# Patient Record
Sex: Female | Born: 1940 | Race: White | Hispanic: No | Smoking: Never smoker
Health system: Southern US, Community
[De-identification: ages and names within clinical notes are randomized; demographics above are authoritative.]

## PROBLEM LIST (undated history)

## (undated) DIAGNOSIS — E039 Hypothyroidism, unspecified: Secondary | ICD-10-CM

## (undated) DIAGNOSIS — N952 Postmenopausal atrophic vaginitis: Secondary | ICD-10-CM

## (undated) DIAGNOSIS — Z972 Presence of dental prosthetic device (complete) (partial): Secondary | ICD-10-CM

## (undated) DIAGNOSIS — R339 Retention of urine, unspecified: Secondary | ICD-10-CM

## (undated) DIAGNOSIS — M1990 Unspecified osteoarthritis, unspecified site: Secondary | ICD-10-CM

## (undated) DIAGNOSIS — I10 Essential (primary) hypertension: Secondary | ICD-10-CM

## (undated) DIAGNOSIS — N8110 Cystocele, unspecified: Secondary | ICD-10-CM

## (undated) DIAGNOSIS — M3320 Polymyositis, organ involvement unspecified: Secondary | ICD-10-CM

## (undated) DIAGNOSIS — G709 Myoneural disorder, unspecified: Secondary | ICD-10-CM

## (undated) HISTORY — DX: Essential (primary) hypertension: I10

## (undated) HISTORY — DX: Postmenopausal atrophic vaginitis: N95.2

## (undated) HISTORY — PX: ABDOMINAL HYSTERECTOMY: SHX81

## (undated) HISTORY — DX: Unspecified osteoarthritis, unspecified site: M19.90

## (undated) HISTORY — PX: CARPAL TUNNEL RELEASE: SHX101

## (undated) HISTORY — PX: COLONOSCOPY: SHX174

## (undated) HISTORY — DX: Cystocele, unspecified: N81.10

## (undated) HISTORY — DX: Retention of urine, unspecified: R33.9

## (undated) HISTORY — DX: Myoneural disorder, unspecified: G70.9

## (undated) HISTORY — PX: MUSCLE BIOPSY: SHX716

---

## 1991-07-26 HISTORY — PX: OTHER SURGICAL HISTORY: SHX169

## 1994-07-25 HISTORY — PX: REPLACEMENT TOTAL HIP W/  RESURFACING IMPLANTS: SUR1222

## 2001-05-09 ENCOUNTER — Encounter (INDEPENDENT_AMBULATORY_CARE_PROVIDER_SITE_OTHER): Payer: Self-pay | Admitting: *Deleted

## 2001-05-10 ENCOUNTER — Inpatient Hospital Stay (HOSPITAL_COMMUNITY): Admission: EM | Admit: 2001-05-10 | Discharge: 2001-05-12 | Payer: Self-pay | Admitting: Emergency Medicine

## 2001-06-04 ENCOUNTER — Other Ambulatory Visit: Admission: RE | Admit: 2001-06-04 | Discharge: 2001-06-04 | Payer: Self-pay | Admitting: Obstetrics and Gynecology

## 2004-10-28 ENCOUNTER — Ambulatory Visit: Payer: Self-pay

## 2005-10-31 ENCOUNTER — Ambulatory Visit: Payer: Self-pay

## 2006-11-06 ENCOUNTER — Ambulatory Visit: Payer: Self-pay | Admitting: Internal Medicine

## 2007-01-22 ENCOUNTER — Ambulatory Visit (HOSPITAL_COMMUNITY): Admission: RE | Admit: 2007-01-22 | Discharge: 2007-01-22 | Payer: Self-pay | Admitting: *Deleted

## 2007-11-21 ENCOUNTER — Ambulatory Visit: Payer: Self-pay | Admitting: Internal Medicine

## 2007-12-24 DIAGNOSIS — D239 Other benign neoplasm of skin, unspecified: Secondary | ICD-10-CM

## 2007-12-24 HISTORY — DX: Other benign neoplasm of skin, unspecified: D23.9

## 2008-11-24 ENCOUNTER — Ambulatory Visit: Payer: Self-pay | Admitting: Internal Medicine

## 2009-11-25 ENCOUNTER — Ambulatory Visit: Payer: Self-pay | Admitting: Internal Medicine

## 2010-11-30 ENCOUNTER — Ambulatory Visit: Payer: Self-pay | Admitting: Internal Medicine

## 2010-12-07 NOTE — Op Note (Signed)
Cassidy Bell, Cassidy Bell NO.:  [id number]   MEDICAL RECORD NO.:  [id number]          PATIENT TYPE:  AMB   LOCATION:  ENDO                         FACILITY:  [facility]   PHYSICIAN:  Georgiana Spinner, M.D.    DATE OF BIRTH:  08-20-1940   DATE OF PROCEDURE:  DATE OF DISCHARGE:                               OPERATIVE REPORT   Audio too short to transcribe (less than 5 seconds)           ______________________________  Georgiana Spinner, M.D.     GMO/MEDQ  D:  01/22/2007  T:  01/22/2007  Job:  [id number]

## 2010-12-07 NOTE — Op Note (Signed)
NAMENAYANA, LENIG NO.:  [id number]   MEDICAL RECORD NO.:  [id number]          PATIENT TYPE:  AMB   LOCATION:  ENDO                         FACILITY:  [facility]   PHYSICIAN:  Georgiana Spinner, M.D.    DATE OF BIRTH:  August 16, 1940   DATE OF PROCEDURE:  DATE OF DISCHARGE:                               OPERATIVE REPORT   PROCEDURE:  Colonoscopy.   INDICATIONS:  Colon cancer screening.   ANESTHESIA:  Demerol 100 mg, Versed 10 mg.   PROCEDURE:  With the patient mildly sedated in the left lateral  decubitus position, the Pentax videoscopic colonoscope was inserted in  the rectum and passed under direct vision with pressure applied until we  reached the cecum, identified by the ileocecal valve and appendiceal  orifice, both of which were photographed.  From this point the  colonoscope was slowly withdrawn, taking circumferential views of the  colonic mucosa as best we could see.  There was quite a bit of  tortuosity to the colon, and some areas could not be optimally  visualized despite maneuvers to do so, but no gross lesions were seen as  we withdrew all the way to the rectum which appeared normal on direct  and showed hemorrhoids on retroflexed view.  The endoscope was  straightened and withdrawn.  The patient's vital signs and pulse  oximeter remained stable.  The patient tolerated the procedure well  without apparent complication.   FINDINGS:  Internal hemorrhoids; otherwise, an unremarkable examination.   PLAN:  Because of family history, will repeat examination in 5 years or  as needed.           ______________________________  Georgiana Spinner, M.D.     GMO/MEDQ  D:  01/22/2007  T:  01/22/2007  Job:  [id number]

## 2010-12-10 NOTE — Discharge Summary (Signed)
[name]  Patient:    Cassidy Bell, Cassidy Bell Visit Number: [id number] MRN: [id number]          Service Type: MED Location: [phone] Attending Physician:  Sabino Gasser Dictated by:   Sabino Gasser, M.D. Admit Date:  05/09/2001 Discharge Date: 05/12/2001                             Discharge Summary  HISTORY OF PRESENT ILLNESS:  Ms. Aguilera is a 70 year old female with history of hypertension who presented with acute onset of abdominal pain followed by diarrhea and hematochezia.  She previously had been feeling well with no history of previous GI problems and negative colonoscopy about four years prior to admission.  REVIEW OF SYSTEMS:  Otherwise negative and no history of recent antibiotic use.  MEDICATIONS:  Zyrtec, Lotensin, and Premarin.  SOCIAL HISTORY:  She lives at home.  PHYSICAL EXAMINATION:  She was alert and oriented female in no distress.  Exam was notable for soft and nontender abdomen with positive bowel sounds.  LABS:  Essentially normal.  CT scan showed thickening of transverse and descending colon and she was admitted for observation on the 17th.  On the 18th, she underwent colonoscopy which showed a mostly mild colitis with some areas of more severe colitis involving the transverse and descending colon as noted on the CT scan. Biopsies were taken.  On the following day, the patient was tolerating a low residue diet with some degree of nausea but no vomiting. Abdomen was soft and nontender and vital signs were stable so it was elected to discharge her home. She was to call us for the results of the biopsies and follow-up as an outpatient.  DISPOSITION:  The patient was discharged home on her home medications as previously.  LABS:  Urinalysis was unremarkable.  CBC showed hematocrit 37.6, white count of 10,800.  CMET entirely normal except for an albumin of 3.4Dictated by: Sabino Gasser, M.D. Attending Physician:  Sabino Gasser DD:  05/12/04 TD:  05/14/01 Job: [id number]

## 2010-12-10 NOTE — Procedures (Signed)
[place]. [facility]  Patient:    Cassidy Bell, Cassidy Bell Visit Number: [id number] MRN: [id number]          Service Type: MED Location: [phone] Attending Physician:  Sabino Gasser Dictated by:   Sabino Gasser, M.D. Admit Date:  05/09/2001 Discharge Date: 05/12/2001                             Procedure Report  PROCEDURE:  Colonoscopy.  PROCEDURE INDICATION:  Acute onset of abdominal pain, rectal bleeding, and abnormal CT scan showing thickening of the transverse and descending colon.  ANESTHESIA:  Demerol 100 mg, Versed 10 mg.  DESCRIPTION OF PROCEDURE:  With the patient mildly sedated in the left lateral decubitus position, the Olympus videoscopic variable-stiffness colonoscope was inserted in the rectum and passed under direct vision through the thickened area to the cecum, identified by the ileocecal valve and appendiceal orifice, both of which were photographed.  From this point the colonoscope was slowly withdrawn, taking circumferential views of the entire colonic mucosa, stopping in the mid- to distal transverse colon and subsequently in the descending colon, where biopsies and photographs were taken of the inflamed mucosa.  It ranged from mild to moderate to one area of moderately severe, but this was a very small area in the descending colon.  All of these areas were biopsied. The endoscope was then slowly further withdrawn, taking circumferential views of the remaining colonic mucosa, stopping in the rectum, which appeared normal on direct and retroflex view.  The endoscope was straightened and withdrawn. The patients vital signs and pulse oximetry remained stable.  The patient tolerated the procedure well without apparent complications.  FINDINGS:  Changes of localized colitis.  At this time patient has improved, has active bowel sounds, abdomen is nontender.  We will allow her to be discharged from the hospital today with outpatient follow-up.   She will call me for the results of her biopsies next week and follow up with me as an outpatient as well.  We will place her on a low-fiber diet, low-residue diet for approximately two weeks. Dictated by:   Sabino Gasser, M.D. Attending Physician:  Sabino Gasser DD:  05/11/01 TD:  05/14/01 Job: [id number]

## 2011-06-29 ENCOUNTER — Telehealth: Payer: Self-pay | Admitting: Internal Medicine

## 2011-06-29 NOTE — Telephone Encounter (Signed)
Error.Cassidy Bell ° °

## 2011-08-08 DIAGNOSIS — J0100 Acute maxillary sinusitis, unspecified: Secondary | ICD-10-CM | POA: Diagnosis not present

## 2011-08-08 DIAGNOSIS — L0201 Cutaneous abscess of face: Secondary | ICD-10-CM | POA: Diagnosis not present

## 2011-08-08 DIAGNOSIS — L03211 Cellulitis of face: Secondary | ICD-10-CM | POA: Diagnosis not present

## 2011-09-27 DIAGNOSIS — L820 Inflamed seborrheic keratosis: Secondary | ICD-10-CM | POA: Diagnosis not present

## 2011-09-27 DIAGNOSIS — L821 Other seborrheic keratosis: Secondary | ICD-10-CM | POA: Diagnosis not present

## 2011-09-27 DIAGNOSIS — L819 Disorder of pigmentation, unspecified: Secondary | ICD-10-CM | POA: Diagnosis not present

## 2011-10-24 DIAGNOSIS — Z96649 Presence of unspecified artificial hip joint: Secondary | ICD-10-CM | POA: Diagnosis not present

## 2011-10-24 DIAGNOSIS — M1610 Unilateral primary osteoarthritis, unspecified hip: Secondary | ICD-10-CM | POA: Diagnosis not present

## 2011-10-24 DIAGNOSIS — Z471 Aftercare following joint replacement surgery: Secondary | ICD-10-CM | POA: Diagnosis not present

## 2011-10-24 DIAGNOSIS — M169 Osteoarthritis of hip, unspecified: Secondary | ICD-10-CM | POA: Insufficient documentation

## 2011-10-24 DIAGNOSIS — Z9889 Other specified postprocedural states: Secondary | ICD-10-CM | POA: Insufficient documentation

## 2011-10-24 DIAGNOSIS — M25559 Pain in unspecified hip: Secondary | ICD-10-CM | POA: Diagnosis not present

## 2011-11-03 DIAGNOSIS — M76899 Other specified enthesopathies of unspecified lower limb, excluding foot: Secondary | ICD-10-CM | POA: Diagnosis not present

## 2011-11-08 DIAGNOSIS — M76899 Other specified enthesopathies of unspecified lower limb, excluding foot: Secondary | ICD-10-CM | POA: Diagnosis not present

## 2011-11-10 DIAGNOSIS — M76899 Other specified enthesopathies of unspecified lower limb, excluding foot: Secondary | ICD-10-CM | POA: Diagnosis not present

## 2011-11-15 DIAGNOSIS — M76899 Other specified enthesopathies of unspecified lower limb, excluding foot: Secondary | ICD-10-CM | POA: Diagnosis not present

## 2011-11-17 DIAGNOSIS — M76899 Other specified enthesopathies of unspecified lower limb, excluding foot: Secondary | ICD-10-CM | POA: Diagnosis not present

## 2011-11-18 DIAGNOSIS — L03039 Cellulitis of unspecified toe: Secondary | ICD-10-CM | POA: Diagnosis not present

## 2011-11-18 DIAGNOSIS — L02619 Cutaneous abscess of unspecified foot: Secondary | ICD-10-CM | POA: Diagnosis not present

## 2011-11-18 DIAGNOSIS — M79609 Pain in unspecified limb: Secondary | ICD-10-CM | POA: Diagnosis not present

## 2011-11-20 DIAGNOSIS — L0390 Cellulitis, unspecified: Secondary | ICD-10-CM | POA: Diagnosis not present

## 2011-11-23 DIAGNOSIS — M76899 Other specified enthesopathies of unspecified lower limb, excluding foot: Secondary | ICD-10-CM | POA: Diagnosis not present

## 2011-11-23 DIAGNOSIS — L0390 Cellulitis, unspecified: Secondary | ICD-10-CM | POA: Diagnosis not present

## 2011-11-23 DIAGNOSIS — R609 Edema, unspecified: Secondary | ICD-10-CM | POA: Diagnosis not present

## 2011-11-23 DIAGNOSIS — M79609 Pain in unspecified limb: Secondary | ICD-10-CM | POA: Diagnosis not present

## 2011-11-23 DIAGNOSIS — M25559 Pain in unspecified hip: Secondary | ICD-10-CM | POA: Diagnosis not present

## 2011-12-01 ENCOUNTER — Ambulatory Visit: Payer: Self-pay | Admitting: Internal Medicine

## 2011-12-01 DIAGNOSIS — Z1231 Encounter for screening mammogram for malignant neoplasm of breast: Secondary | ICD-10-CM | POA: Diagnosis not present

## 2011-12-05 DIAGNOSIS — E039 Hypothyroidism, unspecified: Secondary | ICD-10-CM | POA: Diagnosis not present

## 2011-12-05 DIAGNOSIS — M159 Polyosteoarthritis, unspecified: Secondary | ICD-10-CM | POA: Diagnosis not present

## 2011-12-05 DIAGNOSIS — M3320 Polymyositis, organ involvement unspecified: Secondary | ICD-10-CM | POA: Diagnosis not present

## 2011-12-08 DIAGNOSIS — M79609 Pain in unspecified limb: Secondary | ICD-10-CM | POA: Diagnosis not present

## 2011-12-13 DIAGNOSIS — L0291 Cutaneous abscess, unspecified: Secondary | ICD-10-CM | POA: Diagnosis not present

## 2011-12-13 DIAGNOSIS — L0390 Cellulitis, unspecified: Secondary | ICD-10-CM | POA: Diagnosis not present

## 2011-12-13 DIAGNOSIS — M159 Polyosteoarthritis, unspecified: Secondary | ICD-10-CM | POA: Diagnosis not present

## 2011-12-13 DIAGNOSIS — E039 Hypothyroidism, unspecified: Secondary | ICD-10-CM | POA: Diagnosis not present

## 2011-12-13 DIAGNOSIS — I10 Essential (primary) hypertension: Secondary | ICD-10-CM | POA: Diagnosis not present

## 2011-12-26 DIAGNOSIS — M1610 Unilateral primary osteoarthritis, unspecified hip: Secondary | ICD-10-CM | POA: Diagnosis not present

## 2011-12-26 DIAGNOSIS — M169 Osteoarthritis of hip, unspecified: Secondary | ICD-10-CM | POA: Diagnosis not present

## 2011-12-26 DIAGNOSIS — M19019 Primary osteoarthritis, unspecified shoulder: Secondary | ICD-10-CM | POA: Insufficient documentation

## 2011-12-26 DIAGNOSIS — Z96649 Presence of unspecified artificial hip joint: Secondary | ICD-10-CM | POA: Diagnosis not present

## 2012-01-27 ENCOUNTER — Other Ambulatory Visit: Payer: Self-pay | Admitting: Internal Medicine

## 2012-01-31 ENCOUNTER — Other Ambulatory Visit: Payer: Self-pay | Admitting: Internal Medicine

## 2012-03-22 DIAGNOSIS — R079 Chest pain, unspecified: Secondary | ICD-10-CM | POA: Diagnosis not present

## 2012-03-22 DIAGNOSIS — K219 Gastro-esophageal reflux disease without esophagitis: Secondary | ICD-10-CM | POA: Diagnosis not present

## 2012-04-27 DIAGNOSIS — Z23 Encounter for immunization: Secondary | ICD-10-CM | POA: Diagnosis not present

## 2012-05-08 DIAGNOSIS — J309 Allergic rhinitis, unspecified: Secondary | ICD-10-CM | POA: Diagnosis not present

## 2012-05-08 DIAGNOSIS — J019 Acute sinusitis, unspecified: Secondary | ICD-10-CM | POA: Diagnosis not present

## 2012-07-03 DIAGNOSIS — H2510 Age-related nuclear cataract, unspecified eye: Secondary | ICD-10-CM | POA: Diagnosis not present

## 2012-07-09 DIAGNOSIS — E039 Hypothyroidism, unspecified: Secondary | ICD-10-CM | POA: Diagnosis not present

## 2012-07-09 DIAGNOSIS — I10 Essential (primary) hypertension: Secondary | ICD-10-CM | POA: Diagnosis not present

## 2012-07-09 DIAGNOSIS — M3320 Polymyositis, organ involvement unspecified: Secondary | ICD-10-CM | POA: Diagnosis not present

## 2012-07-09 DIAGNOSIS — E669 Obesity, unspecified: Secondary | ICD-10-CM | POA: Diagnosis not present

## 2012-07-09 DIAGNOSIS — Z1331 Encounter for screening for depression: Secondary | ICD-10-CM | POA: Diagnosis not present

## 2012-07-09 DIAGNOSIS — Z0000 Encounter for general adult medical examination without abnormal findings: Secondary | ICD-10-CM | POA: Diagnosis not present

## 2012-07-09 DIAGNOSIS — M159 Polyosteoarthritis, unspecified: Secondary | ICD-10-CM | POA: Diagnosis not present

## 2012-07-16 DIAGNOSIS — Z23 Encounter for immunization: Secondary | ICD-10-CM | POA: Diagnosis not present

## 2012-07-16 DIAGNOSIS — M159 Polyosteoarthritis, unspecified: Secondary | ICD-10-CM | POA: Diagnosis not present

## 2012-07-16 DIAGNOSIS — M109 Gout, unspecified: Secondary | ICD-10-CM | POA: Diagnosis not present

## 2012-07-16 DIAGNOSIS — H908 Mixed conductive and sensorineural hearing loss, unspecified: Secondary | ICD-10-CM | POA: Diagnosis not present

## 2012-07-16 DIAGNOSIS — I10 Essential (primary) hypertension: Secondary | ICD-10-CM | POA: Diagnosis not present

## 2012-07-16 DIAGNOSIS — E039 Hypothyroidism, unspecified: Secondary | ICD-10-CM | POA: Diagnosis not present

## 2012-10-02 DIAGNOSIS — J069 Acute upper respiratory infection, unspecified: Secondary | ICD-10-CM | POA: Diagnosis not present

## 2012-10-02 DIAGNOSIS — E039 Hypothyroidism, unspecified: Secondary | ICD-10-CM | POA: Diagnosis not present

## 2012-10-02 DIAGNOSIS — K219 Gastro-esophageal reflux disease without esophagitis: Secondary | ICD-10-CM | POA: Diagnosis not present

## 2012-10-02 DIAGNOSIS — N318 Other neuromuscular dysfunction of bladder: Secondary | ICD-10-CM | POA: Diagnosis not present

## 2012-10-17 DIAGNOSIS — J060 Acute laryngopharyngitis: Secondary | ICD-10-CM | POA: Diagnosis not present

## 2012-10-17 DIAGNOSIS — J301 Allergic rhinitis due to pollen: Secondary | ICD-10-CM | POA: Diagnosis not present

## 2012-10-17 DIAGNOSIS — H6980 Other specified disorders of Eustachian tube, unspecified ear: Secondary | ICD-10-CM | POA: Diagnosis not present

## 2012-10-17 DIAGNOSIS — R490 Dysphonia: Secondary | ICD-10-CM | POA: Diagnosis not present

## 2012-12-04 ENCOUNTER — Ambulatory Visit: Payer: Self-pay | Admitting: Internal Medicine

## 2012-12-04 DIAGNOSIS — Z1231 Encounter for screening mammogram for malignant neoplasm of breast: Secondary | ICD-10-CM | POA: Diagnosis not present

## 2012-12-05 ENCOUNTER — Ambulatory Visit: Payer: Self-pay | Admitting: Internal Medicine

## 2012-12-05 DIAGNOSIS — N63 Unspecified lump in unspecified breast: Secondary | ICD-10-CM | POA: Diagnosis not present

## 2012-12-05 DIAGNOSIS — R928 Other abnormal and inconclusive findings on diagnostic imaging of breast: Secondary | ICD-10-CM | POA: Diagnosis not present

## 2012-12-07 DIAGNOSIS — N63 Unspecified lump in unspecified breast: Secondary | ICD-10-CM | POA: Diagnosis not present

## 2012-12-12 ENCOUNTER — Encounter (INDEPENDENT_AMBULATORY_CARE_PROVIDER_SITE_OTHER): Payer: Self-pay

## 2012-12-20 DIAGNOSIS — J019 Acute sinusitis, unspecified: Secondary | ICD-10-CM | POA: Diagnosis not present

## 2012-12-21 ENCOUNTER — Ambulatory Visit (INDEPENDENT_AMBULATORY_CARE_PROVIDER_SITE_OTHER): Payer: Medicare Other | Admitting: Surgery

## 2012-12-24 ENCOUNTER — Encounter (INDEPENDENT_AMBULATORY_CARE_PROVIDER_SITE_OTHER): Payer: Self-pay

## 2012-12-25 ENCOUNTER — Encounter (INDEPENDENT_AMBULATORY_CARE_PROVIDER_SITE_OTHER): Payer: Self-pay | Admitting: Surgery

## 2012-12-25 ENCOUNTER — Ambulatory Visit (INDEPENDENT_AMBULATORY_CARE_PROVIDER_SITE_OTHER): Payer: Medicare Other | Admitting: Surgery

## 2012-12-25 VITALS — BP 128/72 | HR 80 | Temp 97.4°F | Resp 18 | Ht 62.0 in | Wt 166.2 lb

## 2012-12-25 DIAGNOSIS — N6310 Unspecified lump in the right breast, unspecified quadrant: Secondary | ICD-10-CM | POA: Insufficient documentation

## 2012-12-25 DIAGNOSIS — N63 Unspecified lump in unspecified breast: Secondary | ICD-10-CM | POA: Diagnosis not present

## 2012-12-25 NOTE — Progress Notes (Signed)
NAME: Cassidy Bell DOB: [birth year] MRN: [id number]                                                                                      DATE: 12/25/2012  PCP: Georgianne Fick, MD Referring Provider: Georgianne Fick, MD  IMPRESSION:  Mammographic abnormality, right breast, showing dilated ducts and possible nodules  PLAN:   we do not have the films to review today so we have requested them. Once they're available we can review them and see if this area is amenable to a needle core biopsy, or needing a wire localized excisional biopsy. I gone over this with the patient and her daughter. I also told them I think this is most likely benign.                 CC:  Chief Complaint  Patient presents with  . Breast Problem    eval 2 nodes rt br    HPI:  Cassidy Bell is a 72 y.o.  female who presents for evaluation of a recent abnormality seen on mammography at [facility]. There is new increased retroareolar soft tissues consistent with dilated ducts and 2 indeterminate hypoechoic nodules at 2:00 position. She is otherwise asymptomatic. She has no nipple discharge. She has a negative family history for breast cancer. She's never had any other prior breast problems or surgery.  PMH:  has a past medical history of Hypertension; Neuromuscular disorder; and Arthritis.  PSH:   has past surgical history that includes Colonoscopy; Carpal tunnel release; Abdominal hysterectomy; and Muscle biopsy.  ALLERGIES:   Allergies  Allergen Reactions  . Bactrim (Sulfamethoxazole-Tmp Ds) Nausea And Vomiting    MEDICATIONS: Current outpatient prescriptions:amoxicillin (AMOXIL) 875 MG tablet, , Disp: , Rfl: ;  aspirin 81 MG tablet, Take 81 mg by mouth daily., Disp: , Rfl: ;  benazepril (LOTENSIN) 10 MG tablet, Take 10 mg by mouth daily., Disp: , Rfl: ;  Calcium Carbonate-Vitamin D (CALCIUM 600 + D PO), Take by mouth., Disp: , Rfl: ;  cetirizine (ZYRTEC) 10 MG tablet, Take 10 mg  by mouth daily., Disp: , Rfl: ;  fluconazole (DIFLUCAN) 150 MG tablet, , Disp: , Rfl:  fluticasone (FLONASE) 50 MCG/ACT nasal spray, Place 2 sprays into the nose daily., Disp: , Rfl: ;  Levothyroxine Sodium 75 MCG CAPS, Take by mouth daily before breakfast., Disp: , Rfl: ;  Multiple Vitamin (MULTIVITAMIN) tablet, Take 1 tablet by mouth daily., Disp: , Rfl: ;  OXYBUTYNIN CHLORIDE PO, Take 5 mg by mouth 2 (two) times daily., Disp: , Rfl:   ROS: She has filled out our 12 point review of systems and it is negative . EXAM:   Vital signs:BP 128/72  Pulse 80  Temp(Src) 97.4 F (36.3 C) (Temporal)  Resp 18  Ht 5\' 2"  (1.575 m)  Wt 166 lb 3.2 oz (75.388 kg)  BMI 30.39 kg/m2 Gen.: Patient alert oriented healthy-appearing, NAD Breasts: Symmetric, no masses, no tenderness, no skin nipple or areolar changes, no nipple discharge  Lymphatics: No axillary or supraclavicular adenopathy  DATA REVIEWED:  I reviewed the mammogram and ultrasound reports from [facility].  Penni Penado J 12/25/2012  CC: Georgianne Fick, MD, Georgianne Fick, MD

## 2012-12-25 NOTE — Patient Instructions (Signed)
We will obtain the mammogram and ultrasound images from [facility] and review them and then make recommendations about next steps

## 2012-12-27 DIAGNOSIS — M169 Osteoarthritis of hip, unspecified: Secondary | ICD-10-CM | POA: Diagnosis not present

## 2012-12-27 DIAGNOSIS — Z471 Aftercare following joint replacement surgery: Secondary | ICD-10-CM | POA: Diagnosis not present

## 2012-12-27 DIAGNOSIS — M25519 Pain in unspecified shoulder: Secondary | ICD-10-CM | POA: Diagnosis not present

## 2012-12-27 DIAGNOSIS — Z96649 Presence of unspecified artificial hip joint: Secondary | ICD-10-CM | POA: Diagnosis not present

## 2012-12-27 DIAGNOSIS — M898X9 Other specified disorders of bone, unspecified site: Secondary | ICD-10-CM | POA: Diagnosis not present

## 2012-12-27 DIAGNOSIS — M1990 Unspecified osteoarthritis, unspecified site: Secondary | ICD-10-CM | POA: Diagnosis not present

## 2012-12-27 DIAGNOSIS — M19019 Primary osteoarthritis, unspecified shoulder: Secondary | ICD-10-CM | POA: Diagnosis not present

## 2012-12-27 DIAGNOSIS — S43016A Anterior dislocation of unspecified humerus, initial encounter: Secondary | ICD-10-CM | POA: Diagnosis not present

## 2013-01-03 ENCOUNTER — Encounter (INDEPENDENT_AMBULATORY_CARE_PROVIDER_SITE_OTHER): Payer: Self-pay

## 2013-01-22 ENCOUNTER — Encounter (INDEPENDENT_AMBULATORY_CARE_PROVIDER_SITE_OTHER): Payer: Self-pay

## 2013-03-04 DIAGNOSIS — N390 Urinary tract infection, site not specified: Secondary | ICD-10-CM | POA: Diagnosis not present

## 2013-04-02 DIAGNOSIS — K219 Gastro-esophageal reflux disease without esophagitis: Secondary | ICD-10-CM | POA: Diagnosis not present

## 2013-04-02 DIAGNOSIS — M3320 Polymyositis, organ involvement unspecified: Secondary | ICD-10-CM | POA: Diagnosis not present

## 2013-04-02 DIAGNOSIS — N318 Other neuromuscular dysfunction of bladder: Secondary | ICD-10-CM | POA: Diagnosis not present

## 2013-04-02 DIAGNOSIS — I10 Essential (primary) hypertension: Secondary | ICD-10-CM | POA: Diagnosis not present

## 2013-04-09 DIAGNOSIS — R928 Other abnormal and inconclusive findings on diagnostic imaging of breast: Secondary | ICD-10-CM | POA: Diagnosis not present

## 2013-04-09 DIAGNOSIS — I10 Essential (primary) hypertension: Secondary | ICD-10-CM | POA: Diagnosis not present

## 2013-04-09 DIAGNOSIS — E039 Hypothyroidism, unspecified: Secondary | ICD-10-CM | POA: Diagnosis not present

## 2013-04-09 DIAGNOSIS — M159 Polyosteoarthritis, unspecified: Secondary | ICD-10-CM | POA: Diagnosis not present

## 2013-05-03 DIAGNOSIS — Z23 Encounter for immunization: Secondary | ICD-10-CM | POA: Diagnosis not present

## 2013-06-11 ENCOUNTER — Ambulatory Visit: Payer: Self-pay | Admitting: Internal Medicine

## 2013-06-11 DIAGNOSIS — N63 Unspecified lump in unspecified breast: Secondary | ICD-10-CM | POA: Diagnosis not present

## 2013-06-11 DIAGNOSIS — R928 Other abnormal and inconclusive findings on diagnostic imaging of breast: Secondary | ICD-10-CM | POA: Diagnosis not present

## 2013-07-30 DIAGNOSIS — M159 Polyosteoarthritis, unspecified: Secondary | ICD-10-CM | POA: Diagnosis not present

## 2013-07-30 DIAGNOSIS — M109 Gout, unspecified: Secondary | ICD-10-CM | POA: Diagnosis not present

## 2013-07-30 DIAGNOSIS — I10 Essential (primary) hypertension: Secondary | ICD-10-CM | POA: Diagnosis not present

## 2013-07-30 DIAGNOSIS — E039 Hypothyroidism, unspecified: Secondary | ICD-10-CM | POA: Diagnosis not present

## 2013-07-30 DIAGNOSIS — Z0000 Encounter for general adult medical examination without abnormal findings: Secondary | ICD-10-CM | POA: Diagnosis not present

## 2013-07-30 DIAGNOSIS — Z1331 Encounter for screening for depression: Secondary | ICD-10-CM | POA: Diagnosis not present

## 2013-08-06 DIAGNOSIS — Z0000 Encounter for general adult medical examination without abnormal findings: Secondary | ICD-10-CM | POA: Diagnosis not present

## 2013-08-06 DIAGNOSIS — E785 Hyperlipidemia, unspecified: Secondary | ICD-10-CM | POA: Diagnosis not present

## 2013-08-06 DIAGNOSIS — E669 Obesity, unspecified: Secondary | ICD-10-CM | POA: Diagnosis not present

## 2013-08-06 DIAGNOSIS — M3320 Polymyositis, organ involvement unspecified: Secondary | ICD-10-CM | POA: Diagnosis not present

## 2013-08-06 DIAGNOSIS — I10 Essential (primary) hypertension: Secondary | ICD-10-CM | POA: Diagnosis not present

## 2013-08-20 DIAGNOSIS — H2510 Age-related nuclear cataract, unspecified eye: Secondary | ICD-10-CM | POA: Diagnosis not present

## 2013-12-05 ENCOUNTER — Ambulatory Visit: Payer: Self-pay | Admitting: Internal Medicine

## 2013-12-05 DIAGNOSIS — N63 Unspecified lump in unspecified breast: Secondary | ICD-10-CM | POA: Diagnosis not present

## 2013-12-26 DIAGNOSIS — M19019 Primary osteoarthritis, unspecified shoulder: Secondary | ICD-10-CM | POA: Diagnosis not present

## 2013-12-26 DIAGNOSIS — M169 Osteoarthritis of hip, unspecified: Secondary | ICD-10-CM | POA: Diagnosis not present

## 2013-12-26 DIAGNOSIS — Z96649 Presence of unspecified artificial hip joint: Secondary | ICD-10-CM | POA: Diagnosis not present

## 2013-12-26 DIAGNOSIS — M167 Other unilateral secondary osteoarthritis of hip: Secondary | ICD-10-CM | POA: Diagnosis not present

## 2013-12-26 DIAGNOSIS — M1610 Unilateral primary osteoarthritis, unspecified hip: Secondary | ICD-10-CM | POA: Diagnosis not present

## 2013-12-26 DIAGNOSIS — M1990 Unspecified osteoarthritis, unspecified site: Secondary | ICD-10-CM | POA: Diagnosis not present

## 2013-12-26 DIAGNOSIS — Z471 Aftercare following joint replacement surgery: Secondary | ICD-10-CM | POA: Diagnosis not present

## 2013-12-26 DIAGNOSIS — M25519 Pain in unspecified shoulder: Secondary | ICD-10-CM | POA: Diagnosis not present

## 2013-12-26 DIAGNOSIS — M19219 Secondary osteoarthritis, unspecified shoulder: Secondary | ICD-10-CM | POA: Diagnosis not present

## 2013-12-26 DIAGNOSIS — M47817 Spondylosis without myelopathy or radiculopathy, lumbosacral region: Secondary | ICD-10-CM | POA: Diagnosis not present

## 2014-02-23 DIAGNOSIS — R350 Frequency of micturition: Secondary | ICD-10-CM | POA: Diagnosis not present

## 2014-02-23 DIAGNOSIS — N390 Urinary tract infection, site not specified: Secondary | ICD-10-CM | POA: Diagnosis not present

## 2014-02-25 DIAGNOSIS — M949 Disorder of cartilage, unspecified: Secondary | ICD-10-CM | POA: Diagnosis not present

## 2014-02-25 DIAGNOSIS — I10 Essential (primary) hypertension: Secondary | ICD-10-CM | POA: Diagnosis not present

## 2014-02-25 DIAGNOSIS — M899 Disorder of bone, unspecified: Secondary | ICD-10-CM | POA: Diagnosis not present

## 2014-03-04 DIAGNOSIS — E039 Hypothyroidism, unspecified: Secondary | ICD-10-CM | POA: Diagnosis not present

## 2014-03-04 DIAGNOSIS — E785 Hyperlipidemia, unspecified: Secondary | ICD-10-CM | POA: Diagnosis not present

## 2014-03-04 DIAGNOSIS — I10 Essential (primary) hypertension: Secondary | ICD-10-CM | POA: Diagnosis not present

## 2014-03-04 DIAGNOSIS — M3320 Polymyositis, organ involvement unspecified: Secondary | ICD-10-CM | POA: Diagnosis not present

## 2014-04-24 DIAGNOSIS — Z23 Encounter for immunization: Secondary | ICD-10-CM | POA: Diagnosis not present

## 2014-06-28 ENCOUNTER — Inpatient Hospital Stay: Payer: Self-pay | Admitting: Internal Medicine

## 2014-06-28 DIAGNOSIS — Z96643 Presence of artificial hip joint, bilateral: Secondary | ICD-10-CM | POA: Diagnosis present

## 2014-06-28 DIAGNOSIS — E039 Hypothyroidism, unspecified: Secondary | ICD-10-CM | POA: Diagnosis present

## 2014-06-28 DIAGNOSIS — Z7982 Long term (current) use of aspirin: Secondary | ICD-10-CM | POA: Diagnosis not present

## 2014-06-28 DIAGNOSIS — K551 Chronic vascular disorders of intestine: Secondary | ICD-10-CM | POA: Diagnosis not present

## 2014-06-28 DIAGNOSIS — N39498 Other specified urinary incontinence: Secondary | ICD-10-CM | POA: Diagnosis present

## 2014-06-28 DIAGNOSIS — K625 Hemorrhage of anus and rectum: Secondary | ICD-10-CM | POA: Diagnosis not present

## 2014-06-28 DIAGNOSIS — D72829 Elevated white blood cell count, unspecified: Secondary | ICD-10-CM | POA: Diagnosis not present

## 2014-06-28 DIAGNOSIS — K633 Ulcer of intestine: Secondary | ICD-10-CM | POA: Diagnosis present

## 2014-06-28 DIAGNOSIS — K921 Melena: Secondary | ICD-10-CM | POA: Diagnosis not present

## 2014-06-28 DIAGNOSIS — Z800 Family history of malignant neoplasm of digestive organs: Secondary | ICD-10-CM | POA: Diagnosis not present

## 2014-06-28 DIAGNOSIS — K5791 Diverticulosis of intestine, part unspecified, without perforation or abscess with bleeding: Secondary | ICD-10-CM | POA: Diagnosis present

## 2014-06-28 DIAGNOSIS — N390 Urinary tract infection, site not specified: Secondary | ICD-10-CM | POA: Diagnosis not present

## 2014-06-28 DIAGNOSIS — K922 Gastrointestinal hemorrhage, unspecified: Secondary | ICD-10-CM | POA: Diagnosis not present

## 2014-06-28 DIAGNOSIS — K559 Vascular disorder of intestine, unspecified: Secondary | ICD-10-CM | POA: Diagnosis present

## 2014-06-28 DIAGNOSIS — I10 Essential (primary) hypertension: Secondary | ICD-10-CM | POA: Diagnosis not present

## 2014-06-28 DIAGNOSIS — Z882 Allergy status to sulfonamides status: Secondary | ICD-10-CM | POA: Diagnosis not present

## 2014-06-28 DIAGNOSIS — R197 Diarrhea, unspecified: Secondary | ICD-10-CM | POA: Diagnosis not present

## 2014-06-28 DIAGNOSIS — N3001 Acute cystitis with hematuria: Secondary | ICD-10-CM | POA: Diagnosis not present

## 2014-06-28 DIAGNOSIS — D120 Benign neoplasm of cecum: Secondary | ICD-10-CM | POA: Diagnosis not present

## 2014-06-28 LAB — CBC WITH DIFFERENTIAL/PLATELET
BASOS PCT: 0.3 %
Basophil #: 0 10*3/uL (ref 0.0–0.1)
EOS PCT: 1.3 %
Eosinophil #: 0.1 10*3/uL (ref 0.0–0.7)
HCT: 34.8 % — ABNORMAL LOW (ref 35.0–47.0)
HGB: 11.3 g/dL — AB (ref 12.0–16.0)
LYMPHS PCT: 10.7 %
Lymphocyte #: 1.2 10*3/uL (ref 1.0–3.6)
MCH: 28.9 pg (ref 26.0–34.0)
MCHC: 32.4 g/dL (ref 32.0–36.0)
MCV: 89 fL (ref 80–100)
MONO ABS: 1 x10 3/mm — AB (ref 0.2–0.9)
Monocyte %: 8.6 %
NEUTROS PCT: 79.1 %
Neutrophil #: 9 10*3/uL — ABNORMAL HIGH (ref 1.4–6.5)
PLATELETS: 169 10*3/uL (ref 150–440)
RBC: 3.91 10*6/uL (ref 3.80–5.20)
RDW: 13.4 % (ref 11.5–14.5)
WBC: 11.3 10*3/uL — ABNORMAL HIGH (ref 3.6–11.0)

## 2014-06-28 LAB — URINALYSIS, COMPLETE
Bilirubin,UR: NEGATIVE
Blood: NEGATIVE
Glucose,UR: NEGATIVE mg/dL (ref 0–75)
Ketone: NEGATIVE
NITRITE: POSITIVE
PH: 7 (ref 4.5–8.0)
PROTEIN: NEGATIVE
RBC,UR: 2 /HPF (ref 0–5)
Specific Gravity: 1.004 (ref 1.003–1.030)
Squamous Epithelial: 2
WBC UR: 30 /HPF (ref 0–5)

## 2014-06-28 LAB — PROTIME-INR
INR: 1
Prothrombin Time: 13.1 secs (ref 11.5–14.7)

## 2014-06-28 LAB — APTT: Activated PTT: 31.5 secs (ref 23.6–35.9)

## 2014-06-28 LAB — COMPREHENSIVE METABOLIC PANEL
ALBUMIN: 3.5 g/dL (ref 3.4–5.0)
ALK PHOS: 64 U/L
ALT: 43 U/L
AST: 40 U/L — AB (ref 15–37)
Anion Gap: 8 (ref 7–16)
BILIRUBIN TOTAL: 0.6 mg/dL (ref 0.2–1.0)
BUN: 11 mg/dL (ref 7–18)
CREATININE: 0.7 mg/dL (ref 0.60–1.30)
Calcium, Total: 8.8 mg/dL (ref 8.5–10.1)
Chloride: 101 mmol/L (ref 98–107)
Co2: 27 mmol/L (ref 21–32)
EGFR (Non-African Amer.): >60
GLUCOSE: 89 mg/dL (ref 65–99)
OSMOLALITY: 271 (ref 275–301)
Potassium: 3.8 mmol/L (ref 3.5–5.1)
SODIUM: 136 mmol/L (ref 136–145)
Total Protein: 7.3 g/dL (ref 6.4–8.2)

## 2014-06-28 LAB — CBC
HCT: 38.4 % (ref 35.0–47.0)
HGB: 12.5 g/dL (ref 12.0–16.0)
MCH: 29 pg (ref 26.0–34.0)
MCHC: 32.6 g/dL (ref 32.0–36.0)
MCV: 89 fL (ref 80–100)
Platelet: 199 10*3/uL (ref 150–440)
RBC: 4.31 10*6/uL (ref 3.80–5.20)
RDW: 13.4 % (ref 11.5–14.5)
WBC: 13.2 10*3/uL — ABNORMAL HIGH (ref 3.6–11.0)

## 2014-06-28 LAB — LIPASE, BLOOD: LIPASE: 92 U/L (ref 73–393)

## 2014-06-29 LAB — CBC WITH DIFFERENTIAL/PLATELET
Basophil #: 0 10*3/uL (ref 0.0–0.1)
Basophil %: 0.2 %
EOS ABS: 0.1 10*3/uL (ref 0.0–0.7)
EOS PCT: 1.4 %
HCT: 34.1 % — ABNORMAL LOW (ref 35.0–47.0)
HGB: 11.4 g/dL — ABNORMAL LOW (ref 12.0–16.0)
LYMPHS PCT: 13 %
Lymphocyte #: 1.3 10*3/uL (ref 1.0–3.6)
MCH: 29.8 pg (ref 26.0–34.0)
MCHC: 33.5 g/dL (ref 32.0–36.0)
MCV: 89 fL (ref 80–100)
MONO ABS: 0.9 x10 3/mm (ref 0.2–0.9)
Monocyte %: 9.2 %
Neutrophil #: 7.6 10*3/uL — ABNORMAL HIGH (ref 1.4–6.5)
Neutrophil %: 76.2 %
PLATELETS: 183 10*3/uL (ref 150–440)
RBC: 3.84 10*6/uL (ref 3.80–5.20)
RDW: 13.4 % (ref 11.5–14.5)
WBC: 9.9 10*3/uL (ref 3.6–11.0)

## 2014-06-29 LAB — BASIC METABOLIC PANEL
Anion Gap: 9 (ref 7–16)
BUN: 10 mg/dL (ref 7–18)
Calcium, Total: 8.1 mg/dL — ABNORMAL LOW (ref 8.5–10.1)
Chloride: 110 mmol/L — ABNORMAL HIGH (ref 98–107)
Co2: 23 mmol/L (ref 21–32)
Creatinine: 0.58 mg/dL — ABNORMAL LOW (ref 0.60–1.30)
EGFR (Non-African Amer.): >60
GLUCOSE: 79 mg/dL (ref 65–99)
OSMOLALITY: 281 (ref 275–301)
Potassium: 3.7 mmol/L (ref 3.5–5.1)
Sodium: 142 mmol/L (ref 136–145)

## 2014-06-30 LAB — CBC WITH DIFFERENTIAL/PLATELET
BASOS ABS: 0 10*3/uL (ref 0.0–0.1)
BASOS PCT: 0.4 %
EOS ABS: 0.3 10*3/uL (ref 0.0–0.7)
EOS PCT: 3.2 %
HCT: 33.5 % — ABNORMAL LOW (ref 35.0–47.0)
HGB: 11.1 g/dL — ABNORMAL LOW (ref 12.0–16.0)
LYMPHS ABS: 1.2 10*3/uL (ref 1.0–3.6)
Lymphocyte %: 15.5 %
MCH: 29.3 pg (ref 26.0–34.0)
MCHC: 33 g/dL (ref 32.0–36.0)
MCV: 89 fL (ref 80–100)
Monocyte #: 0.9 x10 3/mm (ref 0.2–0.9)
Monocyte %: 11 %
NEUTROS PCT: 69.9 %
Neutrophil #: 5.5 10*3/uL (ref 1.4–6.5)
Platelet: 174 10*3/uL (ref 150–440)
RBC: 3.78 10*6/uL — ABNORMAL LOW (ref 3.80–5.20)
RDW: 13.8 % (ref 11.5–14.5)
WBC: 7.9 10*3/uL (ref 3.6–11.0)

## 2014-06-30 LAB — BASIC METABOLIC PANEL
ANION GAP: 7 (ref 7–16)
BUN: 9 mg/dL (ref 7–18)
CALCIUM: 8.1 mg/dL — AB (ref 8.5–10.1)
CHLORIDE: 111 mmol/L — AB (ref 98–107)
Co2: 24 mmol/L (ref 21–32)
Creatinine: 0.57 mg/dL — ABNORMAL LOW (ref 0.60–1.30)
EGFR (Non-African Amer.): >60
GLUCOSE: 83 mg/dL (ref 65–99)
OSMOLALITY: 281 (ref 275–301)
POTASSIUM: 4 mmol/L (ref 3.5–5.1)
SODIUM: 142 mmol/L (ref 136–145)

## 2014-06-30 LAB — URINE CULTURE

## 2014-07-01 DIAGNOSIS — K625 Hemorrhage of anus and rectum: Secondary | ICD-10-CM | POA: Diagnosis not present

## 2014-07-01 LAB — HEMOGLOBIN: HGB: 10.5 g/dL — ABNORMAL LOW (ref 12.0–16.0)

## 2014-07-02 LAB — URINE CULTURE

## 2014-08-08 DIAGNOSIS — Z1389 Encounter for screening for other disorder: Secondary | ICD-10-CM | POA: Diagnosis not present

## 2014-08-08 DIAGNOSIS — L0390 Cellulitis, unspecified: Secondary | ICD-10-CM | POA: Diagnosis not present

## 2014-08-13 DIAGNOSIS — K559 Vascular disorder of intestine, unspecified: Secondary | ICD-10-CM | POA: Diagnosis not present

## 2014-08-13 DIAGNOSIS — K625 Hemorrhage of anus and rectum: Secondary | ICD-10-CM | POA: Diagnosis not present

## 2014-09-02 DIAGNOSIS — Z0000 Encounter for general adult medical examination without abnormal findings: Secondary | ICD-10-CM | POA: Diagnosis not present

## 2014-09-02 DIAGNOSIS — M1000 Idiopathic gout, unspecified site: Secondary | ICD-10-CM | POA: Diagnosis not present

## 2014-09-02 DIAGNOSIS — I10 Essential (primary) hypertension: Secondary | ICD-10-CM | POA: Diagnosis not present

## 2014-09-02 DIAGNOSIS — Z1389 Encounter for screening for other disorder: Secondary | ICD-10-CM | POA: Diagnosis not present

## 2014-09-02 DIAGNOSIS — M150 Primary generalized (osteo)arthritis: Secondary | ICD-10-CM | POA: Diagnosis not present

## 2014-09-02 DIAGNOSIS — E039 Hypothyroidism, unspecified: Secondary | ICD-10-CM | POA: Diagnosis not present

## 2014-09-24 DIAGNOSIS — E039 Hypothyroidism, unspecified: Secondary | ICD-10-CM | POA: Diagnosis not present

## 2014-09-24 DIAGNOSIS — M3320 Polymyositis, organ involvement unspecified: Secondary | ICD-10-CM | POA: Diagnosis not present

## 2014-09-24 DIAGNOSIS — M150 Primary generalized (osteo)arthritis: Secondary | ICD-10-CM | POA: Diagnosis not present

## 2014-09-24 DIAGNOSIS — E782 Mixed hyperlipidemia: Secondary | ICD-10-CM | POA: Diagnosis not present

## 2014-09-24 DIAGNOSIS — I10 Essential (primary) hypertension: Secondary | ICD-10-CM | POA: Diagnosis not present

## 2014-09-29 DIAGNOSIS — H43392 Other vitreous opacities, left eye: Secondary | ICD-10-CM | POA: Diagnosis not present

## 2014-11-07 ENCOUNTER — Other Ambulatory Visit: Payer: Self-pay | Admitting: Internal Medicine

## 2014-11-07 DIAGNOSIS — N63 Unspecified lump in unspecified breast: Secondary | ICD-10-CM

## 2014-11-15 NOTE — Consult Note (Signed)
Details:   - GI Note:  Hgb stable.  Colonoscopy scheduled for tomorrow.   Electronic Signatures: Arther Dames (MD)  (Signed 07-Dec-15 17:04)  Authored: Details   Last Updated: 07-Dec-15 17:04 by Arther Dames (MD)

## 2014-11-15 NOTE — H&P (Signed)
PATIENT NAME:  Cassidy Bell, Cassidy Bell MR#:  [id number] DATE OF BIRTH:  15-Jun-1941  DATE OF ADMISSION:  06/28/2014  REFERRING EMERGENCY ROOM PHYSICIAN: Lavonia Drafts, MD  PRIMARY CARE PHYSICIAN:  Dr.  Ashby Dawes     CHIEF COMPLAINT: Bright red blood per rectum.   HISTORY OF PRESENT ILLNESS:  This very pleasant 74 year old woman with past medical history of hypertension and hypothyroidism presents today with 2 days of bright red blood in her stool.  She reports that she was in her normal state of health yesterday morning. She ate a protein nut bar for breakfast and within a few hours sat down to have a bowel movement.  As she was sitting, she felt flushed all over and developed severe palpitation and proceeded to have a large bowel movement, which was mixed with bright red blood.  She had multiple similar bowel movements throughout the day yesterday all mixed with bright red blood.  She states that when she stood up, blood continued to gush out.  She wore a maxi pad all day and had to change it several times.  This morning, she once again had dark red maroon stools. She did eat 1/2 pack of grits this morning.  She has not had any nausea or vomiting.  She does report cramping and bloating, but no severe abdominal pain.  She had greater than 5 bowel movements yesterday. She has had 2 to 3 this morning.  She reports that she had similar symptoms several years ago and had a colonoscopy during which a blood vessel was cauterized. Her last colonoscopy was about 9 years ago.  She does not have a primary gastroenterologist at this time.  Currently she does not have any abdominal pain, palpitations, chest pain, dizziness or weakness.  In the Emergency Room, she continues to have maroon stools which are heme positive. She is being admitted for further evaluation and treatment.   PAST MEDICAL HISTORY:  1.  Hypothyroidism.  2.  Hypertension.  3.  Urinary incontinence due to prolapse.  4.  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   PAST SURGICAL HISTORY:   1.  Bilateral hip replacement.  2.  Bladder tack.  3.  Total abdominal hysterectomy.  4.  Bilateral shoulder repair.  5.  Carpal tunnel release.   SOCIAL HISTORY: The patient lives alone. She does not smoke cigarettes or drink alcohol or use any illicit substances.  She uses a cane for ambulation at night, but not during the day. She is retired.   FAMILY MEDICAL HISTORY: Negative for coronary artery disease, negative for stroke. Her father died of colon cancer in his 31s.  No history of breast cancer or prostate cancer.   ALLERGIES: SULFA DRUGS WHICH CAUSE NAUSEA, VOMITING AND DIARRHEA.   HOME MEDICATIONS:  1.  Zyrtec 10 mg 1 tablet daily.  2.  Vitamin D-3, 2000 units 1 tablet daily.  3.  Oxybutynin 5 mg 1 tablet daily.  4.  Multivitamin 1 tablet daily.  5.  Levothyroxine 0.75 mg 1 tablet daily.  6.  Fluticasone nasal spray 50 mcg, 2 sprays to each nostril once a day.  7.  Calcium carbonate 600 mg 1 tablet twice a day.  8.  Benazepril 10 mg 1 tablet once a day.  9   Aspirin 81 mg 1 tablet once a day.   REVIEW OF SYSTEMS:  GENERAL: Negative for fevers, chills, weakness or weight change.  HEENT: No change in vision or hearing. No pain in  the eyes or ears. No sore throat or difficulty swallowing.  RESPIRATORY: No cough, wheezing, congestion, no history of chronic obstructive pulmonary disease or tuberculosis.  CARDIOVASCULAR: No chest pain, edema, orthopnea.  She does report palpitations during her initial episode of hematochezia, but none since, no syncope.  GASTROINTESTINAL: Positive for diarrhea, abdominal pain, hematochezia. Negative for nausea or vomiting.  GENITOURINARY: Negative for dysuria or frequency. She does have urinary incontinence.  MUSCULOSKELETAL: No new pain in the neck, back, shoulders, hips, or knees, no swelling, no history of gout.  NEUROLOGIC: No focal numbness, weakness, dysarthria, confusion,  memory loss or seizure.  PSYCHIATRIC: No uncontrolled anxiety or depression.   PHYSICAL EXAMINATION:  VITAL SIGNS: Temperature 97.9, pulse 73, respirations 18, blood pressure 112/69, oxygenation 100% on room air.  GENERAL: No acute distress.  HEENT: Pupils are equal, round, and reactive to light. Extraocular motion intact. Conjunctivae are clear. Oral mucous membranes are pink and moist, posterior oropharynx is clear with no exudate, no edema, no erythema. She does have dentures, no cervical lymphadenopathy. Thyroid is nontender, trachea is midline.  RESPIRATORY: Lungs are clear to auscultation bilaterally with good air movement.  CARDIOVASCULAR: Regular rate and rhythm, no murmurs, rubs, or gallops. No peripheral edema. Peripheral pulses are 2+.  ABDOMEN: Soft, slightly tender, particularly in the left lower quadrant.  No guarding, no rebound. Bowel sounds are normal.  MUSCULOSKELETAL: No joint effusions, strength is 5/5 throughout.  NEUROLOGIC: Cranial nerves II through XII grossly intact.  Strength and sensation intact.  Nonfocal neurological examination.   PSYCHIATRIC:  The patient is alert and oriented x 4 with good insight into her clinical condition, no signs of uncontrolled depression or anxiety.    LABORATORY DATA:  Sodium 136, potassium 3.8, chloride 101, bicarbonate 27,, BUN 11, creatinine 0.70, glucose 89, lipase 92, LFTs are normal. White blood cell is 13.2, hemoglobin 12.5, platelets 199,000.  MCV 89.  Urinalysis is positive for a urinary tract infection with 30 white blood cells and 2 red blood cells.    IMAGING:  No imaging.    ASSESSMENT AND PLAN :   1.  Acute gastrointestinal bleed.  This likely a lower gastrointestinal bleed with hematochezia and mild abdominal pain.  She reports what sounds like a diverticular bleed in the past.  Her last colonoscopy was 9 years ago.  We are admitting for further evaluation and treatment. Gastrointestinal consultation has been placed.  She is  not having active bleeding at this time. She will be admitted to the regular floor.  We will check a CBC again in the morning unless further bleeding is noted.  She is n.p.o. for possible procedure.  She is not on any anticoagulants at home other than aspirin 81 mg which will be held at this time.  2.  Leukocytosis: Likely due to gastrointestinal bleeding. Will obtain stool cultures.  3.  Urinary tract infection: We will obtain a urine culture.  4.  Hypertension:  She is not hypertensive at this time and I will hold her oral antihypertensive medications.  We will provide IV hydration.  5.  Hypothyroidism: Continue with levothyroxine.  6.  Prophylaxis:  We will refrain from any pharmacological deep vein thrombosis prophylaxis due to active bleeding. We will start GI prophylaxis with Protonix.   TIME SPENT ON ADMISSION: 45 minutes.     ____________________________ Earleen Newport. Volanda Napoleon, MD cpw:DT D: 06/28/2014 14:48:42 ET T: 06/28/2014 15:10:51 ET JOB#: [id number]  cc: Barnetta Chapel P. Volanda Napoleon, MD, <Dictator> Aldean Jewett MD ELECTRONICALLY SIGNED 06/28/2014  16:30 

## 2014-11-15 NOTE — Discharge Summary (Signed)
PATIENT NAME:  Cassidy Bell, Cassidy Bell MR#:  [id number] DATE OF BIRTH:  August 01, 1940  DATE OF ADMISSION:  06/28/2014 DATE OF DISCHARGE:  07/01/2014  ADMISSION DIAGNOSIS:  Lower gastrointestinal bleed.   DISCHARGE DIAGNOSIS:  Lower gastrointestinal bleed, likely ischemic colitis.   CONSULTATIONS:  Arther Dames, MD   PROCEDURES: The patient underwent a colonoscopy 07/01/2014 which showed one 4 mm polyp that was resected and retrieved.  Diffuse moderate inflammation was done in the descending colon at the splenic flexure and the distal transverse colon secondary to ischemic colitis.   LABORATORIES AT DISCHARGE:  Hemoglobin is 10.5.   DISCHARGE PHYSICAL EXAMINATION:  VITAL SIGNS: The patient is afebrile. Temperature 97.4, pulse 58, respirations 17, blood pressure 124/79, 100% on room air.  GENERAL: The patient is alert, oriented, not in acute distress.  CARDIOVASCULAR: Regular rate and rhythm. No murmurs, gallops, or rubs. PMI is not displaced.   LUNGS: Clear to auscultation without crackles, rales, rhonchi or wheezing. Normal to percussion. ABDOMEN:  Bowel sounds positive. Nontender, nondistended. No hepatosplenomegaly.  EXTREMITIES: No clubbing, cyanosis or edema.   HOSPITAL COURSE: This is a very pleasant 74 year old female who presented with rectal bleed.  For further details, please refer to the H and P.  1.  Lower gastrointestinal bleed. The patient presented with diffuse rectal bleeding, which has resolved.  While the patient was here in the hospital, her hemoglobin remained relatively stable. She underwent a colonoscopy on 07/01/2014, which showed evidence of ischemic colitis.  I spoke with Dr. Rayann Heman who said that he felt this is resolving.  Biopsies were taken. The patient may be discharged home carefully. We are holding her aspirin until follow-up with her primary care physician.  2.  Urinary tract infection. Urine culture showed some contamination and no growth.  At this time, she will  continue on Levaquin for 2 more days.  3.  Essential hypertension. Blood pressure was within normal limits.  4.  Hypothyroidism, on Synthroid.   DISCHARGE MEDICATIONS:  1.  Benazepril 10 mg daily.  2.  Synthroid 75 mcg daily.  3.  Vitamin D3, at 2000 international units daily.  4.  Multivitamin 1 tablet daily. 5.  Calcium carbonate 600 mg b.i.d.  6.  Zyrtec 10 mg daily. 7.  Fluticasone nasal spray 2 sprays daily.  8.  Oxybutynin 5 mg daily.  9.  Levaquin 250 mg daily for 2 days.   DISCHARGE DIET: Regular diet.   DISCHARGE ACTIVITY: As tolerated.   FOLLOW-UP:  The patient may follow-up with Dr. Rayann Heman in 2 weeks.     TIME SPENT: Approximately 40 minutes. The patient was stable for discharge.     ____________________________ Eilam Shrewsbury P. Benjie Karvonen, MD spm:DT D: 07/01/2014 13:34:56 ET T: 07/01/2014 15:06:47 ET JOB#: [id number]  cc: Bettyjean Stefanski P. Benjie Karvonen, MD, <Dictator> Arther Dames, MD Donell Beers Markevious Ehmke MD ELECTRONICALLY SIGNED 07/01/2014 20:39

## 2014-11-17 LAB — SURGICAL PATHOLOGY

## 2014-11-19 NOTE — Consult Note (Signed)
PATIENT NAME:  Cassidy Bell, Cassidy Bell MR#:  [id number] DATE OF BIRTH:  1941-07-14  DATE OF CONSULTATION:  06/29/2014  REFERRING PHYSICIAN:  Gladstone Lighter, MD   CONSULTING PHYSICIAN:  Arther Dames, MD  REASON FOR CONSULTATION: Rectal bleeding.  HISTORY OF PRESENT ILLNESS: Ms. Speranza is a 74 year old female with a past medical history notable for hypertension and hypothyroidism presenting to the Emergency Room for evaluation of rectal bleeding. She reports that approximately 2 days ago she developed sudden onset of abdominal fullness, sweating, and dizziness. Shortly after that she developed several episodes of stool mixed with blood. Her last episode of stool was at approximately 2:30 this morning. That was mostly blood but did also have some stool mixed into it.   She does report trouble with lower GI bleed approximately 12 to 13 years ago. She thinks she was told that there was a problem with a membrane that was treated. Unclear what this means. She also reports a colonoscopy about 9 years ago over at [facility].  She does have a family history of father who had colon cancer in his 30s.  PAST MEDICAL HISTORY: Hypothyroidism, hypertension, urinary incontinence, polymyositis.  PAST SURGICAL HISTORY: Bilateral hip replacement, bladder tack, hysterectomy, shoulder repair, carpal tunnel repair.  SOCIAL HISTORY: She denies alcohol or tobacco.    FAMILY HISTORY: She had a father with colon cancer in his 71s.  ALLERGIES: SULFA.  HOME MEDICATIONS: Zyrtec, vitamin D3, oxybutynin, multivitamin, levothyroxine, fluticasone, calcium, benazepril, and aspirin.  REVIEW OF SYSTEMS: A 10 system review was conducted. It is negative except as stated in the HPI.  PHYSICAL EXAMINATION:  VITAL SIGNS: Temperature is 98.3, pulse is 69, respirations are 18, blood pressure 105/69, pulse oximetry is 99% on room air. GENERAL: Alert and oriented times 4.  No acute distress. Appears stated age. HEENT:  Normocephalic/atraumatic. Extraocular movements are intact. Anicteric. NECK: Soft, supple. JVP appears normal. No adenopathy. CHEST: Clear to auscultation. No wheeze or crackle. Respirations unlabored. HEART: Regular. No murmur, rub, or gallop.  Normal S1 and S2. ABDOMEN: Soft, nontender, nondistended.  Normal active bowel sounds in all four quadrants.  No organomegaly. No masses EXTREMITIES: No swelling, well perfused. SKIN: No rash or lesion. Skin color, texture, turgor normal. NEUROLOGICAL: Grossly intact. PSYCHIATRIC: Normal tone and affect. MUSCULOSKELETAL: No joint swelling or erythema.   LABORATORY DATA: Sodium 142, potassium 3.7, BUN 10, creatinine 0.58. Lipase is normal. Liver enzymes are normal. White count 9.9, hemoglobin is currently 11.4, which is down from 12.5, platelets are 183,000. Her INR is 1.0.   ASSESSMENT AND PLAN: Lower gastrointestinal bleed: The most likely source of this would be a diverticular bleed. Based on her hemoglobin and her last stool at 2:30 this morning I do suspect the bleeding has resolved. I offered the option of either doing a colonoscopy as an outpatient or as an inpatient. She would strongly prefer to have it done as an inpatient.  RECOMMENDATIONS:  1.  Continue clear liquids. 2.  Continue to monitor hemoglobin and hemodynamics until stable. 3.  Obtain tagged red blood cell scan if evidence of active bleeding. 4.  We will plan for colonoscopy on Tuesday afternoon.   Thank you for this consult.         ____________________________ Arther Dames, MD mr:ts D: 06/29/2014 16:57:00 ET T: 06/29/2014 17:59:31 ET JOB#: [id number]  cc: Arther Dames, MD, <Dictator> Mellody Life MD ELECTRONICALLY SIGNED 07/28/2014 14:17

## 2014-12-08 ENCOUNTER — Other Ambulatory Visit: Payer: Self-pay | Admitting: Internal Medicine

## 2014-12-08 ENCOUNTER — Ambulatory Visit
Admission: RE | Admit: 2014-12-08 | Discharge: 2014-12-08 | Disposition: A | Discharge status: Home / Self Care | Payer: Medicare Other | Source: Ambulatory Visit | Attending: Internal Medicine | Admitting: Internal Medicine

## 2014-12-08 DIAGNOSIS — N6001 Solitary cyst of right breast: Secondary | ICD-10-CM | POA: Diagnosis not present

## 2014-12-08 DIAGNOSIS — N63 Unspecified lump in unspecified breast: Secondary | ICD-10-CM

## 2014-12-15 DIAGNOSIS — Z9660 Presence of unspecified orthopedic joint implant: Secondary | ICD-10-CM | POA: Diagnosis not present

## 2014-12-15 DIAGNOSIS — M25551 Pain in right hip: Secondary | ICD-10-CM | POA: Diagnosis not present

## 2014-12-15 DIAGNOSIS — Z471 Aftercare following joint replacement surgery: Secondary | ICD-10-CM | POA: Diagnosis not present

## 2014-12-15 DIAGNOSIS — Z96643 Presence of artificial hip joint, bilateral: Secondary | ICD-10-CM | POA: Diagnosis not present

## 2014-12-15 DIAGNOSIS — M19212 Secondary osteoarthritis, left shoulder: Secondary | ICD-10-CM | POA: Diagnosis not present

## 2014-12-15 DIAGNOSIS — M19211 Secondary osteoarthritis, right shoulder: Secondary | ICD-10-CM | POA: Diagnosis not present

## 2014-12-24 DIAGNOSIS — M25551 Pain in right hip: Secondary | ICD-10-CM | POA: Diagnosis not present

## 2015-01-06 DIAGNOSIS — Z7982 Long term (current) use of aspirin: Secondary | ICD-10-CM | POA: Diagnosis not present

## 2015-01-06 DIAGNOSIS — M25551 Pain in right hip: Secondary | ICD-10-CM | POA: Diagnosis not present

## 2015-01-06 DIAGNOSIS — Z96641 Presence of right artificial hip joint: Secondary | ICD-10-CM | POA: Diagnosis not present

## 2015-01-06 DIAGNOSIS — Z9660 Presence of unspecified orthopedic joint implant: Secondary | ICD-10-CM | POA: Diagnosis not present

## 2015-01-06 DIAGNOSIS — T8484XA Pain due to internal orthopedic prosthetic devices, implants and grafts, initial encounter: Secondary | ICD-10-CM | POA: Diagnosis not present

## 2015-01-06 DIAGNOSIS — Z96643 Presence of artificial hip joint, bilateral: Secondary | ICD-10-CM | POA: Diagnosis not present

## 2015-01-06 DIAGNOSIS — Z471 Aftercare following joint replacement surgery: Secondary | ICD-10-CM | POA: Diagnosis not present

## 2015-01-14 DIAGNOSIS — M25551 Pain in right hip: Secondary | ICD-10-CM | POA: Diagnosis not present

## 2015-01-15 DIAGNOSIS — I10 Essential (primary) hypertension: Secondary | ICD-10-CM | POA: Diagnosis not present

## 2015-01-15 DIAGNOSIS — J309 Allergic rhinitis, unspecified: Secondary | ICD-10-CM | POA: Insufficient documentation

## 2015-01-15 DIAGNOSIS — Z01810 Encounter for preprocedural cardiovascular examination: Secondary | ICD-10-CM | POA: Diagnosis not present

## 2015-01-15 DIAGNOSIS — T8484XA Pain due to internal orthopedic prosthetic devices, implants and grafts, initial encounter: Secondary | ICD-10-CM | POA: Diagnosis not present

## 2015-01-15 DIAGNOSIS — E039 Hypothyroidism, unspecified: Secondary | ICD-10-CM | POA: Insufficient documentation

## 2015-01-15 DIAGNOSIS — Z01818 Encounter for other preprocedural examination: Secondary | ICD-10-CM | POA: Diagnosis not present

## 2015-01-28 DIAGNOSIS — R2689 Other abnormalities of gait and mobility: Secondary | ICD-10-CM | POA: Diagnosis not present

## 2015-01-28 DIAGNOSIS — M160 Bilateral primary osteoarthritis of hip: Secondary | ICD-10-CM | POA: Diagnosis present

## 2015-01-28 DIAGNOSIS — D62 Acute posthemorrhagic anemia: Secondary | ICD-10-CM | POA: Diagnosis not present

## 2015-01-28 DIAGNOSIS — T84030A Mechanical loosening of internal right hip prosthetic joint, initial encounter: Secondary | ICD-10-CM | POA: Diagnosis present

## 2015-01-28 DIAGNOSIS — Z96641 Presence of right artificial hip joint: Secondary | ICD-10-CM | POA: Diagnosis not present

## 2015-01-28 DIAGNOSIS — M19011 Primary osteoarthritis, right shoulder: Secondary | ICD-10-CM | POA: Diagnosis not present

## 2015-01-28 DIAGNOSIS — E039 Hypothyroidism, unspecified: Secondary | ICD-10-CM | POA: Diagnosis not present

## 2015-01-28 DIAGNOSIS — G8918 Other acute postprocedural pain: Secondary | ICD-10-CM | POA: Diagnosis not present

## 2015-01-28 DIAGNOSIS — T849XXA Unspecified complication of internal orthopedic prosthetic device, implant and graft, initial encounter: Secondary | ICD-10-CM | POA: Insufficient documentation

## 2015-01-28 DIAGNOSIS — H04123 Dry eye syndrome of bilateral lacrimal glands: Secondary | ICD-10-CM | POA: Diagnosis not present

## 2015-01-28 DIAGNOSIS — Z471 Aftercare following joint replacement surgery: Secondary | ICD-10-CM | POA: Diagnosis not present

## 2015-01-28 DIAGNOSIS — J309 Allergic rhinitis, unspecified: Secondary | ICD-10-CM | POA: Diagnosis not present

## 2015-01-28 DIAGNOSIS — M25559 Pain in unspecified hip: Secondary | ICD-10-CM | POA: Diagnosis not present

## 2015-01-28 DIAGNOSIS — D649 Anemia, unspecified: Secondary | ICD-10-CM | POA: Diagnosis not present

## 2015-01-28 DIAGNOSIS — I10 Essential (primary) hypertension: Secondary | ICD-10-CM | POA: Diagnosis not present

## 2015-01-28 DIAGNOSIS — M19012 Primary osteoarthritis, left shoulder: Secondary | ICD-10-CM | POA: Diagnosis not present

## 2015-01-28 DIAGNOSIS — M6281 Muscle weakness (generalized): Secondary | ICD-10-CM | POA: Diagnosis not present

## 2015-01-28 DIAGNOSIS — Z472 Encounter for removal of internal fixation device: Secondary | ICD-10-CM | POA: Diagnosis not present

## 2015-01-28 DIAGNOSIS — M19019 Primary osteoarthritis, unspecified shoulder: Secondary | ICD-10-CM | POA: Diagnosis present

## 2015-01-28 DIAGNOSIS — Z96643 Presence of artificial hip joint, bilateral: Secondary | ICD-10-CM | POA: Diagnosis present

## 2015-01-28 DIAGNOSIS — T84090A Other mechanical complication of internal right hip prosthesis, initial encounter: Secondary | ICD-10-CM | POA: Diagnosis not present

## 2015-01-28 DIAGNOSIS — Z9660 Presence of unspecified orthopedic joint implant: Secondary | ICD-10-CM | POA: Diagnosis not present

## 2015-01-30 ENCOUNTER — Encounter
Admission: RE | Admit: 2015-01-30 | Discharge: 2015-01-30 | Disposition: A | Discharge status: Home / Self Care | Payer: Medicare Other | Source: Ambulatory Visit | Attending: Internal Medicine | Admitting: Internal Medicine

## 2015-01-31 DIAGNOSIS — I10 Essential (primary) hypertension: Secondary | ICD-10-CM | POA: Diagnosis not present

## 2015-01-31 DIAGNOSIS — E039 Hypothyroidism, unspecified: Secondary | ICD-10-CM | POA: Diagnosis not present

## 2015-01-31 DIAGNOSIS — M159 Polyosteoarthritis, unspecified: Secondary | ICD-10-CM | POA: Diagnosis not present

## 2015-01-31 DIAGNOSIS — M19011 Primary osteoarthritis, right shoulder: Secondary | ICD-10-CM | POA: Diagnosis not present

## 2015-01-31 DIAGNOSIS — H04123 Dry eye syndrome of bilateral lacrimal glands: Secondary | ICD-10-CM | POA: Diagnosis not present

## 2015-01-31 DIAGNOSIS — J309 Allergic rhinitis, unspecified: Secondary | ICD-10-CM | POA: Diagnosis not present

## 2015-01-31 DIAGNOSIS — R32 Unspecified urinary incontinence: Secondary | ICD-10-CM | POA: Diagnosis not present

## 2015-01-31 DIAGNOSIS — M6281 Muscle weakness (generalized): Secondary | ICD-10-CM | POA: Diagnosis not present

## 2015-01-31 DIAGNOSIS — Z96641 Presence of right artificial hip joint: Secondary | ICD-10-CM | POA: Diagnosis not present

## 2015-01-31 DIAGNOSIS — R2689 Other abnormalities of gait and mobility: Secondary | ICD-10-CM | POA: Diagnosis not present

## 2015-01-31 DIAGNOSIS — Z471 Aftercare following joint replacement surgery: Secondary | ICD-10-CM | POA: Diagnosis not present

## 2015-01-31 DIAGNOSIS — M19012 Primary osteoarthritis, left shoulder: Secondary | ICD-10-CM | POA: Diagnosis not present

## 2015-01-31 DIAGNOSIS — Z9660 Presence of unspecified orthopedic joint implant: Secondary | ICD-10-CM | POA: Diagnosis not present

## 2015-01-31 DIAGNOSIS — D649 Anemia, unspecified: Secondary | ICD-10-CM | POA: Diagnosis not present

## 2015-02-02 DIAGNOSIS — I10 Essential (primary) hypertension: Secondary | ICD-10-CM | POA: Diagnosis not present

## 2015-02-02 DIAGNOSIS — M159 Polyosteoarthritis, unspecified: Secondary | ICD-10-CM | POA: Diagnosis not present

## 2015-02-02 DIAGNOSIS — R32 Unspecified urinary incontinence: Secondary | ICD-10-CM | POA: Diagnosis not present

## 2015-02-10 DIAGNOSIS — Z9660 Presence of unspecified orthopedic joint implant: Secondary | ICD-10-CM | POA: Diagnosis not present

## 2015-02-10 DIAGNOSIS — Z9889 Other specified postprocedural states: Secondary | ICD-10-CM | POA: Insufficient documentation

## 2015-02-18 DIAGNOSIS — M19011 Primary osteoarthritis, right shoulder: Secondary | ICD-10-CM | POA: Diagnosis not present

## 2015-02-18 DIAGNOSIS — M19012 Primary osteoarthritis, left shoulder: Secondary | ICD-10-CM | POA: Diagnosis not present

## 2015-02-18 DIAGNOSIS — Z4732 Aftercare following explantation of hip joint prosthesis: Secondary | ICD-10-CM | POA: Diagnosis not present

## 2015-02-18 DIAGNOSIS — I10 Essential (primary) hypertension: Secondary | ICD-10-CM | POA: Diagnosis not present

## 2015-02-18 DIAGNOSIS — M75101 Unspecified rotator cuff tear or rupture of right shoulder, not specified as traumatic: Secondary | ICD-10-CM | POA: Diagnosis not present

## 2015-02-18 DIAGNOSIS — Z96642 Presence of left artificial hip joint: Secondary | ICD-10-CM | POA: Diagnosis not present

## 2015-02-18 DIAGNOSIS — Z96641 Presence of right artificial hip joint: Secondary | ICD-10-CM | POA: Diagnosis not present

## 2015-02-19 DIAGNOSIS — I10 Essential (primary) hypertension: Secondary | ICD-10-CM | POA: Diagnosis not present

## 2015-02-19 DIAGNOSIS — M19011 Primary osteoarthritis, right shoulder: Secondary | ICD-10-CM | POA: Diagnosis not present

## 2015-02-19 DIAGNOSIS — Z4732 Aftercare following explantation of hip joint prosthesis: Secondary | ICD-10-CM | POA: Diagnosis not present

## 2015-02-19 DIAGNOSIS — Z96641 Presence of right artificial hip joint: Secondary | ICD-10-CM | POA: Diagnosis not present

## 2015-02-19 DIAGNOSIS — M19012 Primary osteoarthritis, left shoulder: Secondary | ICD-10-CM | POA: Diagnosis not present

## 2015-02-19 DIAGNOSIS — M75101 Unspecified rotator cuff tear or rupture of right shoulder, not specified as traumatic: Secondary | ICD-10-CM | POA: Diagnosis not present

## 2015-02-23 DIAGNOSIS — I10 Essential (primary) hypertension: Secondary | ICD-10-CM | POA: Diagnosis not present

## 2015-02-23 DIAGNOSIS — Z4732 Aftercare following explantation of hip joint prosthesis: Secondary | ICD-10-CM | POA: Diagnosis not present

## 2015-02-23 DIAGNOSIS — M19011 Primary osteoarthritis, right shoulder: Secondary | ICD-10-CM | POA: Diagnosis not present

## 2015-02-23 DIAGNOSIS — M75101 Unspecified rotator cuff tear or rupture of right shoulder, not specified as traumatic: Secondary | ICD-10-CM | POA: Diagnosis not present

## 2015-02-23 DIAGNOSIS — Z96641 Presence of right artificial hip joint: Secondary | ICD-10-CM | POA: Diagnosis not present

## 2015-02-23 DIAGNOSIS — M19012 Primary osteoarthritis, left shoulder: Secondary | ICD-10-CM | POA: Diagnosis not present

## 2015-02-25 DIAGNOSIS — M19011 Primary osteoarthritis, right shoulder: Secondary | ICD-10-CM | POA: Diagnosis not present

## 2015-02-25 DIAGNOSIS — Z96641 Presence of right artificial hip joint: Secondary | ICD-10-CM | POA: Diagnosis not present

## 2015-02-25 DIAGNOSIS — Z4732 Aftercare following explantation of hip joint prosthesis: Secondary | ICD-10-CM | POA: Diagnosis not present

## 2015-02-25 DIAGNOSIS — M19012 Primary osteoarthritis, left shoulder: Secondary | ICD-10-CM | POA: Diagnosis not present

## 2015-02-25 DIAGNOSIS — I10 Essential (primary) hypertension: Secondary | ICD-10-CM | POA: Diagnosis not present

## 2015-02-25 DIAGNOSIS — M75101 Unspecified rotator cuff tear or rupture of right shoulder, not specified as traumatic: Secondary | ICD-10-CM | POA: Diagnosis not present

## 2015-02-26 DIAGNOSIS — M19012 Primary osteoarthritis, left shoulder: Secondary | ICD-10-CM | POA: Diagnosis not present

## 2015-02-26 DIAGNOSIS — M19011 Primary osteoarthritis, right shoulder: Secondary | ICD-10-CM | POA: Diagnosis not present

## 2015-02-26 DIAGNOSIS — Z96641 Presence of right artificial hip joint: Secondary | ICD-10-CM | POA: Diagnosis not present

## 2015-02-26 DIAGNOSIS — I10 Essential (primary) hypertension: Secondary | ICD-10-CM | POA: Diagnosis not present

## 2015-02-26 DIAGNOSIS — Z4732 Aftercare following explantation of hip joint prosthesis: Secondary | ICD-10-CM | POA: Diagnosis not present

## 2015-02-26 DIAGNOSIS — M75101 Unspecified rotator cuff tear or rupture of right shoulder, not specified as traumatic: Secondary | ICD-10-CM | POA: Diagnosis not present

## 2015-03-03 DIAGNOSIS — M19011 Primary osteoarthritis, right shoulder: Secondary | ICD-10-CM | POA: Diagnosis not present

## 2015-03-03 DIAGNOSIS — Z96641 Presence of right artificial hip joint: Secondary | ICD-10-CM | POA: Diagnosis not present

## 2015-03-03 DIAGNOSIS — M19012 Primary osteoarthritis, left shoulder: Secondary | ICD-10-CM | POA: Diagnosis not present

## 2015-03-03 DIAGNOSIS — Z4732 Aftercare following explantation of hip joint prosthesis: Secondary | ICD-10-CM | POA: Diagnosis not present

## 2015-03-03 DIAGNOSIS — M75101 Unspecified rotator cuff tear or rupture of right shoulder, not specified as traumatic: Secondary | ICD-10-CM | POA: Diagnosis not present

## 2015-03-03 DIAGNOSIS — I10 Essential (primary) hypertension: Secondary | ICD-10-CM | POA: Diagnosis not present

## 2015-03-05 DIAGNOSIS — Z96641 Presence of right artificial hip joint: Secondary | ICD-10-CM | POA: Diagnosis not present

## 2015-03-05 DIAGNOSIS — M19012 Primary osteoarthritis, left shoulder: Secondary | ICD-10-CM | POA: Diagnosis not present

## 2015-03-05 DIAGNOSIS — I10 Essential (primary) hypertension: Secondary | ICD-10-CM | POA: Diagnosis not present

## 2015-03-05 DIAGNOSIS — M19011 Primary osteoarthritis, right shoulder: Secondary | ICD-10-CM | POA: Diagnosis not present

## 2015-03-05 DIAGNOSIS — Z4732 Aftercare following explantation of hip joint prosthesis: Secondary | ICD-10-CM | POA: Diagnosis not present

## 2015-03-05 DIAGNOSIS — M75101 Unspecified rotator cuff tear or rupture of right shoulder, not specified as traumatic: Secondary | ICD-10-CM | POA: Diagnosis not present

## 2015-03-10 DIAGNOSIS — Z471 Aftercare following joint replacement surgery: Secondary | ICD-10-CM | POA: Diagnosis not present

## 2015-03-10 DIAGNOSIS — Z96641 Presence of right artificial hip joint: Secondary | ICD-10-CM | POA: Diagnosis not present

## 2015-04-07 DIAGNOSIS — I10 Essential (primary) hypertension: Secondary | ICD-10-CM | POA: Diagnosis not present

## 2015-04-07 DIAGNOSIS — R5383 Other fatigue: Secondary | ICD-10-CM | POA: Diagnosis not present

## 2015-04-07 DIAGNOSIS — M3320 Polymyositis, organ involvement unspecified: Secondary | ICD-10-CM | POA: Diagnosis not present

## 2015-04-07 DIAGNOSIS — E782 Mixed hyperlipidemia: Secondary | ICD-10-CM | POA: Diagnosis not present

## 2015-04-14 DIAGNOSIS — E782 Mixed hyperlipidemia: Secondary | ICD-10-CM | POA: Diagnosis not present

## 2015-04-14 DIAGNOSIS — M150 Primary generalized (osteo)arthritis: Secondary | ICD-10-CM | POA: Diagnosis not present

## 2015-04-14 DIAGNOSIS — E039 Hypothyroidism, unspecified: Secondary | ICD-10-CM | POA: Diagnosis not present

## 2015-04-14 DIAGNOSIS — I10 Essential (primary) hypertension: Secondary | ICD-10-CM | POA: Diagnosis not present

## 2015-04-24 DIAGNOSIS — Z23 Encounter for immunization: Secondary | ICD-10-CM | POA: Diagnosis not present

## 2015-05-12 DIAGNOSIS — M533 Sacrococcygeal disorders, not elsewhere classified: Secondary | ICD-10-CM | POA: Diagnosis not present

## 2015-05-12 DIAGNOSIS — Z96641 Presence of right artificial hip joint: Secondary | ICD-10-CM | POA: Diagnosis not present

## 2015-05-12 DIAGNOSIS — Z471 Aftercare following joint replacement surgery: Secondary | ICD-10-CM | POA: Diagnosis not present

## 2015-06-25 DIAGNOSIS — L821 Other seborrheic keratosis: Secondary | ICD-10-CM | POA: Diagnosis not present

## 2015-06-25 DIAGNOSIS — L659 Nonscarring hair loss, unspecified: Secondary | ICD-10-CM | POA: Diagnosis not present

## 2015-06-25 DIAGNOSIS — D485 Neoplasm of uncertain behavior of skin: Secondary | ICD-10-CM | POA: Diagnosis not present

## 2015-06-25 DIAGNOSIS — D1801 Hemangioma of skin and subcutaneous tissue: Secondary | ICD-10-CM | POA: Diagnosis not present

## 2015-06-25 DIAGNOSIS — L820 Inflamed seborrheic keratosis: Secondary | ICD-10-CM | POA: Diagnosis not present

## 2015-07-28 DIAGNOSIS — Z1389 Encounter for screening for other disorder: Secondary | ICD-10-CM | POA: Diagnosis not present

## 2015-07-28 DIAGNOSIS — N390 Urinary tract infection, site not specified: Secondary | ICD-10-CM | POA: Diagnosis not present

## 2015-08-16 DIAGNOSIS — N3000 Acute cystitis without hematuria: Secondary | ICD-10-CM | POA: Diagnosis not present

## 2015-08-16 DIAGNOSIS — R300 Dysuria: Secondary | ICD-10-CM | POA: Diagnosis not present

## 2015-08-18 ENCOUNTER — Ambulatory Visit (INDEPENDENT_AMBULATORY_CARE_PROVIDER_SITE_OTHER): Payer: Medicare Other | Admitting: Urology

## 2015-08-18 ENCOUNTER — Encounter: Payer: Self-pay | Admitting: Urology

## 2015-08-18 VITALS — BP 115/76 | HR 90 | Ht 62.0 in | Wt 162.5 lb

## 2015-08-18 DIAGNOSIS — I10 Essential (primary) hypertension: Secondary | ICD-10-CM | POA: Insufficient documentation

## 2015-08-18 DIAGNOSIS — IMO0001 Reserved for inherently not codable concepts without codable children: Secondary | ICD-10-CM

## 2015-08-18 DIAGNOSIS — R3915 Urgency of urination: Secondary | ICD-10-CM | POA: Insufficient documentation

## 2015-08-18 DIAGNOSIS — N39490 Overflow incontinence: Secondary | ICD-10-CM | POA: Diagnosis not present

## 2015-08-18 DIAGNOSIS — IMO0002 Reserved for concepts with insufficient information to code with codable children: Secondary | ICD-10-CM

## 2015-08-18 DIAGNOSIS — R399 Unspecified symptoms and signs involving the genitourinary system: Secondary | ICD-10-CM

## 2015-08-18 DIAGNOSIS — N952 Postmenopausal atrophic vaginitis: Secondary | ICD-10-CM | POA: Diagnosis not present

## 2015-08-18 DIAGNOSIS — R339 Retention of urine, unspecified: Secondary | ICD-10-CM | POA: Diagnosis not present

## 2015-08-18 DIAGNOSIS — N8110 Cystocele, unspecified: Secondary | ICD-10-CM | POA: Diagnosis not present

## 2015-08-18 LAB — URINALYSIS, COMPLETE
Bilirubin, UA: NEGATIVE
Glucose, UA: NEGATIVE
Ketones, UA: NEGATIVE
Nitrite, UA: NEGATIVE
PH UA: 7 (ref 5.0–7.5)
Protein, UA: NEGATIVE
RBC, UA: NEGATIVE
Specific Gravity, UA: 1.01 (ref 1.005–1.030)
Urobilinogen, Ur: 0.2 mg/dL (ref 0.2–1.0)

## 2015-08-18 LAB — MICROSCOPIC EXAMINATION: RBC, UA: NONE SEEN /hpf (ref 0–?)

## 2015-08-18 LAB — BLADDER SCAN AMB NON-IMAGING: Scan Result: 664

## 2015-08-18 MED ORDER — ESTRADIOL 0.1 MG/GM VA CREA: 1.0000 | TOPICAL_CREAM | Freq: Every day | VAGINAL | Status: DC

## 2015-08-18 MED ORDER — ESTROGENS, CONJUGATED 0.625 MG/GM VA CREA: 1.0000 | TOPICAL_CREAM | Freq: Every day | VAGINAL | Status: DC

## 2015-08-18 NOTE — Progress Notes (Signed)
08/18/2015 7:58 PM   Cassidy Bell [birth year] [id number]  Referring provider: Merrilee Seashore, MD [address] [place], [place] [address]  Chief Complaint  Patient presents with  . Urinary Tract Infection    referred by [facility] walk in clinic  . Bladder Prolapse    HPI: Patient is a 75 year old Caucasian female who is referred to [place] primary care physician, Dr. Ashby Dawes, for bladder prolapse an UTI.  UTI Patient was seen [facility] clinic acute care with a 2 day history of urinary frequency and lower back pain on 08/16/2015.  Her urinalysis was nitrite positive and contained large amount of leukocyte and many bacteria.  She was given a course of Cipro and referred to [place].  Today, she is not reporting any urinary tract symptoms. Her UA today is unremarkable.  Bladder prolapse Patient states she has been experiencing frequent urination, nocturia and leakage of urine for the last several years. She states now that she just leaks without any warning.  She had been on oxybutynin for her urinary incontinence for approximately 4 years. She is no longer finding it effective.  Her PVR on today's exam was 664 mL.  PMH: Past Medical History  Diagnosis Date  . Hypertension   . Neuromuscular disorder ([place])   . Arthritis   . Arthritis   . Bladder prolapse, female, acquired     Surgical History: Past Surgical History  Procedure Laterality Date  . Colonoscopy    . Carpal tunnel release    . Abdominal hysterectomy    . Muscle biopsy    . Replacement total hip w/  resurfacing implants  1996    Right & Left   . Bladder tach  1993    Home Medications:    Medication List       This list is accurate as of: 08/18/15 11:59 PM.  Always use your most recent med list.               amoxicillin 875 MG tablet  Commonly known as:  AMOXIL  Reported on 08/18/2015     aspirin 81 MG tablet  Take 81 mg by mouth daily.     benazepril 10 MG tablet    Commonly known as:  LOTENSIN  Take 10 mg by mouth daily.     CALCIUM 600 + D PO  Take by mouth.     CALCIUM 600 600 MG Tabs tablet  Generic drug:  calcium carbonate  Take by mouth.     CALCIUM 600+D 600-200 MG-UNIT Tabs  Generic drug:  Calcium Carbonate-Vitamin D  Take by mouth. Reported on 08/18/2015     Calcium Citrate 200 MG Tabs  Take 950 mg by mouth.     cetirizine 10 MG tablet  Commonly known as:  ZYRTEC  Take 10 mg by mouth daily.     conjugated estrogens vaginal cream  Commonly known as:  PREMARIN  Place 1 Applicatorful vaginally daily. Apply 0.5mg  (pea-sized amount)  just inside the vaginal introitus with a finger-tip every night for two weeks and then Monday, Wednesday and Friday nights.     estradiol 0.1 MG/GM vaginal cream  Commonly known as:  ESTRACE  Place 1 Applicatorful vaginally at bedtime.     fluconazole 150 MG tablet  Commonly known as:  DIFLUCAN  Reported on 08/18/2015     fluticasone 50 MCG/ACT nasal spray  Commonly known as:  FLONASE  Place 2 sprays into the nose daily.     Levothyroxine  Sodium 75 MCG Caps  Take by mouth daily before breakfast.     multivitamin tablet  Take 1 tablet by mouth daily.     nitrofurantoin (macrocrystal-monohydrate) 100 MG capsule  Commonly known as:  MACROBID  Take 100 mg by mouth 2 (two) times daily.     oxybutynin 5 MG tablet  Commonly known as:  DITROPAN     Vitamin D3 2000 units capsule  Take by mouth.        Allergies:  Allergies  Allergen Reactions  . Bactrim [Sulfamethoxazole-Trimethoprim] Nausea And Vomiting  . Pollen Extract Other (See Comments)    respiratory  . Sulfa Antibiotics Rash    And GI Upset  . Sulfamethoxazole-Trimethoprim Rash    Family History: Family History  Problem Relation Age of Onset  . Cancer Father     colon  . Kidney cancer Neg Hx   . Renal cancer Neg Hx     Social History:  reports that she has never smoked. She has never used smokeless tobacco. She reports  that she does not drink alcohol or use illicit drugs.  ROS: UROLOGY Frequent Urination?: Yes Hard to postpone urination?: No Burning/pain with urination?: No Get up at night to urinate?: Yes Leakage of urine?: Yes Urine stream starts and stops?: No Trouble starting stream?: No Do you have to strain to urinate?: No Blood in urine?: No Urinary tract infection?: Yes Sexually transmitted disease?: No Injury to kidneys or bladder?: No Painful intercourse?: No Weak stream?: No Currently pregnant?: No Vaginal bleeding?: No Last menstrual period?: hysterectomy  Gastrointestinal Nausea?: No Vomiting?: No Indigestion/heartburn?: No Diarrhea?: No Constipation?: No  Constitutional Fever: No Night sweats?: No Weight loss?: No Fatigue?: No  Skin Skin rash/lesions?: No Itching?: No  Eyes Blurred vision?: No Double vision?: No  Ears/Nose/Throat Sore throat?: No Sinus problems?: No  Hematologic/Lymphatic Swollen glands?: No Easy bruising?: No  Cardiovascular Leg swelling?: No Chest pain?: No  Respiratory Cough?: No Shortness of breath?: No  Endocrine Excessive thirst?: No  Musculoskeletal Back pain?: No Joint pain?: No  Neurological Headaches?: No Dizziness?: No  Psychologic Depression?: No Anxiety?: No  Physical Exam: BP 115/76 mmHg  Pulse 90  Ht 5\' 2"  (1.575 m)  Wt 162 lb 8 oz (73.71 kg)  BMI 29.71 kg/m2  Constitutional: Well nourished. Alert and oriented, No acute distress. HEENT: [place] AT, moist mucus membranes. Trachea midline, no masses. Cardiovascular: No clubbing, cyanosis, or edema. Respiratory: Normal respiratory effort, no increased work of breathing. GI: Abdomen is soft, non tender, non distended, no abdominal masses. Liver and spleen not palpable.  No hernias appreciated.  Stool sample for occult testing is not indicated.   GU: No CVA tenderness.  No bladder fullness or masses.  Atrophic external genitalia, normal pubic hair  distribution, no lesions.  Normal urethral meatus, no lesions, no prolapse, no discharge.   No urethral masses, tenderness and/or tenderness. No bladder fullness, tenderness or masses. Atrophic vagina mucosa, poor estrogen effect, no discharge, no lesions, good pelvic support, Grade II cystocele is noted.  No rectocele noted.  Uterus and cervix are surgically absent.  No pelvic masses or tenderness noted.  Anus and perineum are without rashes or lesions.    Skin: No rashes, bruises or suspicious lesions. Lymph: No cervical or inguinal adenopathy. Neurologic: Grossly intact, no focal deficits, moving all 4 extremities. Psychiatric: Normal mood and affect.  Laboratory Data: Lab Results  Component Value Date   WBC 7.9 06/30/2014   HGB 10.5* 07/01/2014   HCT 33.5*  06/30/2014   MCV 89 06/30/2014   PLT 174 06/30/2014    Lab Results  Component Value Date   CREATININE 0.57* 06/30/2014    Lab Results  Component Value Date   AST 40* 06/28/2014   Lab Results  Component Value Date   ALT 43 06/28/2014    Urinalysis Results for orders placed or performed in visit on 08/18/15  Microscopic Examination  Result Value Ref Range   WBC, UA 0-5 0 -  5 /hpf   RBC, UA None seen 0 -  2 /hpf   Epithelial Cells (non renal) 0-10 0 - 10 /hpf   Bacteria, UA Few (A) None seen/Few  Urinalysis, Complete  Result Value Ref Range   Specific Gravity, UA 1.010 1.005 - 1.030   pH, UA 7.0 5.0 - 7.5   Color, UA Yellow Yellow   Appearance Ur Clear Clear   Leukocytes, UA Trace (A) Negative   Protein, UA Negative Negative/Trace   Glucose, UA Negative Negative   Ketones, UA Negative Negative   RBC, UA Negative Negative   Bilirubin, UA Negative Negative   Urobilinogen, Ur 0.2 0.2 - 1.0 mg/dL   Nitrite, UA Negative Negative   Microscopic Examination See below:     Pertinent Imaging: Results for LAVAUGHN, COURTADE (MRN [id number]) as of 08/22/2015 19:50  Ref. Range 08/18/2015 11:20  Scan Result Unknown [id number]      Assessment & Plan:    1. Urinary retention:    Patient had a Foley catheter placed in our office. She will return in 1 week for voiding trial.  She will also discontinue her oxybutynin.   2. Overflow incontinence:    Patient's urinary continence may be due to her large residual. We will reassess once the Foley catheter is removed and she has discontinued her oxybutynin for two weeks.  - BLADDER SCAN AMB NON-IMAGING  3. Atrophic vaginitis:   Patient was given a sample of vaginal estrogen cream and instructed to apply 0.5mg  (pea-sized amount)  just inside the vaginal introitus with a finger-tip every night for two weeks and then Monday, Wednesday and Friday nights.  I explained to the patient that vaginally administered estrogen, which causes only a slight increase in the blood estrogen levels, have fewer contraindications and adverse systemic effects that oral HT.  4. Cystocele:   We will reassess once the Foley catheter is removed that she has discontinued her oxybutynin for 2 weeks.  She may benefit from a posterior descending.  5. UTI symptoms:  Resolved for now. UA is unremarkable on today's exam.  - Urinalysis, Complete   Return in about 1 week (around 08/25/2015) for cath out in the am for a voiding trial.  These notes generated with voice recognition software. I apologize for typographical errors.  [name], [facility] [address], [place], [place] [address] [phone]

## 2015-08-18 NOTE — Progress Notes (Signed)
Simple Catheter Placement  Due to urinary retention patient is present today for a foley cath placement.  Patient was cleaned and prepped in a sterile fashion with betadine and lidocaine jelly 2% was instilled into the urethra.  A 14FR foley catheter was inserted, urine return was noted  757ml, urine was clear and yellow in color.  The balloon was filled with 10cc of sterile water.  A leg bag was attached for drainage. Patient was also given a night bag to take home and was given instruction on how to change from one bag to another.  Patient was given instruction on proper catheter care.  Patient tolerated well, no complications were noted   Preformed by: Lyndee Hensen CMA  Additional notes/ Follow up: One week

## 2015-08-22 DIAGNOSIS — N39490 Overflow incontinence: Secondary | ICD-10-CM | POA: Insufficient documentation

## 2015-08-22 DIAGNOSIS — IMO0001 Reserved for inherently not codable concepts without codable children: Secondary | ICD-10-CM | POA: Insufficient documentation

## 2015-08-22 DIAGNOSIS — IMO0002 Reserved for concepts with insufficient information to code with codable children: Secondary | ICD-10-CM

## 2015-08-22 DIAGNOSIS — N952 Postmenopausal atrophic vaginitis: Secondary | ICD-10-CM | POA: Insufficient documentation

## 2015-08-22 DIAGNOSIS — R339 Retention of urine, unspecified: Secondary | ICD-10-CM | POA: Insufficient documentation

## 2015-08-25 ENCOUNTER — Ambulatory Visit (INDEPENDENT_AMBULATORY_CARE_PROVIDER_SITE_OTHER): Payer: Medicare Other | Admitting: Urology

## 2015-08-25 ENCOUNTER — Encounter: Payer: Self-pay | Admitting: Urology

## 2015-08-25 VITALS — BP 145/82 | HR 88 | Ht 60.0 in | Wt 165.1 lb

## 2015-08-25 DIAGNOSIS — R339 Retention of urine, unspecified: Secondary | ICD-10-CM

## 2015-08-25 NOTE — Progress Notes (Signed)
9:00 AM   Cassidy Bell [birth year] [id number]  Referring provider: Merrilee Seashore, MD [address] [place], [place] [address]  Chief Complaint  Patient presents with  . Urinary Retention    cath out     HPI: Patient is a 75 year old Caucasian female who is was found to have overflow incontinence and a PVR of over 600 cc who had a foley placed for bladder decompression for one weak.  She returns today to have her foley catheter removed for a voiding trial.    UTI Patient was seen [facility] acute [facility] with a 2 day history of urinary frequency and lower back pain on 08/16/2015.  Her urinalysis was nitrite positive and contained large amount of leukocyte and many bacteria.  She was given a course of Cipro and referred to [place].  Today, she was not reporting any urinary tract symptoms at her last visit. Her UA at that visit was unremarkable.  Bladder prolapse Patient states she has been experiencing frequent urination, nocturia and leakage of urine for the last several years. She states now that she just leaks without any warning.  She had been on oxybutynin for her urinary incontinence for approximately 4 years. She is no longer finding it effective.  Her PVR at her last visit was 664 mL and a foley was placed for decompression.  She was instructed to discontinue her oxybutynin at her last visit as well.  She has not taken the oxybutynin since 08/18/2015.  She is really pressing for a bladder tacking.    PMH: Past Medical History  Diagnosis Date  . Hypertension   . Neuromuscular disorder ([place])   . Arthritis   . Arthritis   . Bladder prolapse, female, acquired     Surgical History: Past Surgical History  Procedure Laterality Date  . Colonoscopy    . Carpal tunnel release    . Abdominal hysterectomy    . Muscle biopsy    . Replacement total hip w/  resurfacing implants  1996    Right & Left   . Bladder tach  1993    Home Medications:      Medication List       This list is accurate as of: 08/25/15  9:00 AM.  Always use your most recent med list.               amoxicillin 875 MG tablet  Commonly known as:  AMOXIL  Reported on 08/25/2015     aspirin 81 MG tablet  Take 81 mg by mouth daily.     benazepril 10 MG tablet  Commonly known as:  LOTENSIN  Take 10 mg by mouth daily.     CALCIUM 600 + D PO  Take by mouth. Reported on 08/25/2015     CALCIUM 600 600 MG Tabs tablet  Generic drug:  calcium carbonate  Take by mouth.     CALCIUM 600+D 600-200 MG-UNIT Tabs  Generic drug:  Calcium Carbonate-Vitamin D  Take by mouth. Reported on 08/25/2015     Calcium Citrate 200 MG Tabs  Take 950 mg by mouth.     cetirizine 10 MG tablet  Commonly known as:  ZYRTEC  Take 10 mg by mouth daily.     conjugated estrogens vaginal cream  Commonly known as:  PREMARIN  Place 1 Applicatorful vaginally daily. Apply 0.5mg  (pea-sized amount)  just inside the vaginal introitus with a finger-tip every night for two weeks and then Monday, Wednesday and  Friday nights.     estradiol 0.1 MG/GM vaginal cream  Commonly known as:  ESTRACE  Place 1 Applicatorful vaginally at bedtime.     fluconazole 150 MG tablet  Commonly known as:  DIFLUCAN  Reported on 08/25/2015     fluticasone 50 MCG/ACT nasal spray  Commonly known as:  FLONASE  Place 2 sprays into the nose daily.     Levothyroxine Sodium 75 MCG Caps  Take by mouth daily before breakfast.     multivitamin tablet  Take 1 tablet by mouth daily.     nitrofurantoin (macrocrystal-monohydrate) 100 MG capsule  Commonly known as:  MACROBID  Take 100 mg by mouth 2 (two) times daily. Reported on 08/25/2015     oxybutynin 5 MG tablet  Commonly known as:  DITROPAN  Reported on 08/25/2015     Vitamin D3 2000 units capsule  Take by mouth.        Allergies:  Allergies  Allergen Reactions  . Bactrim [Sulfamethoxazole-Trimethoprim] Nausea And Vomiting  . Pollen Extract Other (See  Comments)    respiratory  . Sulfa Antibiotics Rash    And GI Upset  . Sulfamethoxazole-Trimethoprim Rash    Family History: Family History  Problem Relation Age of Onset  . Cancer Father     colon  . Kidney cancer Neg Hx   . Renal cancer Neg Hx     Social History:  reports that she has never smoked. She has never used smokeless tobacco. She reports that she does not drink alcohol or use illicit drugs.  ROS: UROLOGY Frequent Urination?: No Hard to postpone urination?: No Burning/pain with urination?: No Get up at night to urinate?: No Leakage of urine?: No Urine stream starts and stops?: No Trouble starting stream?: No Do you have to strain to urinate?: No Blood in urine?: No Urinary tract infection?: No Sexually transmitted disease?: No Injury to kidneys or bladder?: No Painful intercourse?: No Weak stream?: No Currently pregnant?: No Vaginal bleeding?: No Last menstrual period?: n  Gastrointestinal Nausea?: No Vomiting?: No Indigestion/heartburn?: No Diarrhea?: No Constipation?: No  Constitutional Fever: No Night sweats?: No Weight loss?: No Fatigue?: No  Skin Skin rash/lesions?: No Itching?: No  Eyes Blurred vision?: No Double vision?: No  Ears/Nose/Throat Sore throat?: No Sinus problems?: No  Hematologic/Lymphatic Swollen glands?: No Easy bruising?: No  Cardiovascular Leg swelling?: No Chest pain?: No  Respiratory Cough?: No Shortness of breath?: No  Endocrine Excessive thirst?: No  Musculoskeletal Back pain?: No Joint pain?: No  Neurological Headaches?: No Dizziness?: No  Psychologic Depression?: No Anxiety?: No  Physical Exam: BP 145/82 mmHg  Pulse 88  Ht 5' (1.524 m)  Wt 165 lb 1.6 oz (74.889 kg)  BMI 32.24 kg/m2  Constitutional: Well nourished. Alert and oriented, No acute distress. HEENT: [place] AT, moist mucus membranes. Trachea midline, no masses. Cardiovascular: No clubbing, cyanosis, or edema. Respiratory:  Normal respiratory effort, no increased work of breathing. Skin: No rashes, bruises or suspicious lesions. Lymph: No cervical or inguinal adenopathy. Neurologic: Grossly intact, no focal deficits, moving all 4 extremities. Psychiatric: Normal mood and affect.  Laboratory Data: Lab Results  Component Value Date   WBC 7.9 06/30/2014   HGB 10.5* 07/01/2014   HCT 33.5* 06/30/2014   MCV 89 06/30/2014   PLT 174 06/30/2014    Lab Results  Component Value Date   CREATININE 0.57* 06/30/2014    Lab Results  Component Value Date   AST 40* 06/28/2014   Lab Results  Component Value Date  ALT 43 06/28/2014    Urinalysis Results for orders placed or performed in visit on 08/18/15  Microscopic Examination  Result Value Ref Range   WBC, UA 0-5 0 -  5 /hpf   RBC, UA None seen 0 -  2 /hpf   Epithelial Cells (non renal) 0-10 0 - 10 /hpf   Bacteria, UA Few (A) None seen/Few  Urinalysis, Complete  Result Value Ref Range   Specific Gravity, UA 1.010 1.005 - 1.030   pH, UA 7.0 5.0 - 7.5   Color, UA Yellow Yellow   Appearance Ur Clear Clear   Leukocytes, UA Trace (A) Negative   Protein, UA Negative Negative/Trace   Glucose, UA Negative Negative   Ketones, UA Negative Negative   RBC, UA Negative Negative   Bilirubin, UA Negative Negative   Urobilinogen, Ur 0.2 0.2 - 1.0 mg/dL   Nitrite, UA Negative Negative   Microscopic Examination See below:     Pertinent Imaging: Results for LORNA, NEVIN (MRN [id number]) as of 08/22/2015 19:50  Ref. Range 08/18/2015 11:20  Scan Result Unknown [id number]    Assessment & Plan:    1. Urinary retention:    She will returns for voiding trial.  She has discontinued her oxybutynin.  She will be RTC in 2 weeks if she feels she is voiding well on her own for a PVR and symptom recheck.    2. Overflow incontinence:    Patient's urinary continence may be due to her large residual. We will reassess once the Foley catheter is removed and she has  discontinued her oxybutynin for two weeks.  3. Atrophic vaginitis:   She is using the cream as prescribed.  We will examine her when she RTC in 2 weeks.    4. Cystocele:   We will reassess once the Foley catheter is removed that she has discontinued her oxybutynin for 2 weeks.  She may benefit from a pessary fitting.  She is really wanting surgery as she believes it will relieve her incontinence.    5. UTI symptoms:  Resolved for now.   Return in about 2 weeks (around 09/08/2015).  These notes generated with voice recognition software. I apologize for typographical errors.  [name], [facility] [address] [place], [place] [address] [phone]

## 2015-08-25 NOTE — Progress Notes (Signed)
Catheter Removal  Patient is present today for a catheter removal.  39ml of water was drained from the balloon. A 14FR foley cath was removed from the bladder no complications were noted . Patient tolerated well.  Preformed by: Lyndee Hensen CMA  Follow Up: Patient to return by 3:00 PM if she can not void on her own.

## 2015-08-31 ENCOUNTER — Other Ambulatory Visit: Payer: Self-pay | Admitting: Internal Medicine

## 2015-08-31 DIAGNOSIS — Z1231 Encounter for screening mammogram for malignant neoplasm of breast: Secondary | ICD-10-CM

## 2015-09-01 ENCOUNTER — Ambulatory Visit: Payer: Medicare Other | Admitting: Urology

## 2015-09-08 ENCOUNTER — Ambulatory Visit (INDEPENDENT_AMBULATORY_CARE_PROVIDER_SITE_OTHER): Payer: Medicare Other | Admitting: Urology

## 2015-09-08 ENCOUNTER — Encounter: Payer: Self-pay | Admitting: Urology

## 2015-09-08 VITALS — BP 149/81 | HR 79 | Ht 62.0 in | Wt 165.1 lb

## 2015-09-08 DIAGNOSIS — IMO0001 Reserved for inherently not codable concepts without codable children: Secondary | ICD-10-CM

## 2015-09-08 DIAGNOSIS — N8110 Cystocele, unspecified: Secondary | ICD-10-CM | POA: Diagnosis not present

## 2015-09-08 DIAGNOSIS — N952 Postmenopausal atrophic vaginitis: Secondary | ICD-10-CM

## 2015-09-08 DIAGNOSIS — IMO0002 Reserved for concepts with insufficient information to code with codable children: Secondary | ICD-10-CM

## 2015-09-08 DIAGNOSIS — N39490 Overflow incontinence: Secondary | ICD-10-CM | POA: Diagnosis not present

## 2015-09-08 DIAGNOSIS — R339 Retention of urine, unspecified: Secondary | ICD-10-CM

## 2015-09-08 LAB — BLADDER SCAN AMB NON-IMAGING

## 2015-09-08 NOTE — Progress Notes (Signed)
10:55 AM   Cassidy Bell September 22, 1940 [id number]  Referring provider: Merrilee Seashore, MD [address] [place], [place] [address]  Chief Complaint  Patient presents with  . Urinary Retention    2wk    HPI: Patient is a 75 year old Caucasian female who presents today after discontinuing her oxybutynin and having her bladder decompressed with a foley catheter for one week.    Overflow incontinence Patient was found to have overflow incontinence and a PVR of over 600 cc who had a foley placed for bladder decompression for one week.  She was also advised to discontinue the oxybutynin two weeks ago.  She is voiding successfully on her own.  Her PVR today is 158 mL.  She states she is actually surprised as her incontinence is much less.  She is still experiencing incontinence but not as often or as large of volume as in the past.    UTI Patient was seen [facility] with a 2 day history of urinary frequency and lower back pain on 08/16/2015.  Her urinalysis was nitrite positive and contained large amount of leukocyte and many bacteria.  She was given a course of Cipro and referred to [place].  Today, she was not reporting any urinary tract symptoms at her last visit.  Bladder prolapse Patient states she has been experiencing frequent urination, nocturia and leakage of urine for the last several years. She states now that she just leaks without any warning.  She had been on oxybutynin for her urinary incontinence for approximately 4 years. She is no longer finding it effective.  Her PVR at her last visit was 664 mL and a foley was placed for decompression.  She was instructed to discontinue her oxybutynin at her last visit as well.  She has not taken the oxybutynin since 08/18/2015.  She is really pressing for a bladder tacking.  She did admit to an improvement with her incontinence after stopping the oxybutynin.  Her PVR today was 158 mL.  Her neighbor has a  pessary and she would like to have one.  PMH: Past Medical History  Diagnosis Date  . Hypertension   . Neuromuscular disorder ([place])   . Arthritis   . Arthritis   . Bladder prolapse, female, acquired     Surgical History: Past Surgical History  Procedure Laterality Date  . Colonoscopy    . Carpal tunnel release    . Abdominal hysterectomy    . Muscle biopsy    . Replacement total hip w/  resurfacing implants  1996    Right & Left   . Bladder tach  1993    Home Medications:    Medication List       This list is accurate as of: 09/08/15 10:55 AM.  Always use your most recent med list.               aspirin 81 MG tablet  Take 81 mg by mouth daily.     benazepril 10 MG tablet  Commonly known as:  LOTENSIN  Take 10 mg by mouth daily.     CALCIUM 600 + D PO  Take by mouth. Reported on 08/25/2015     CALCIUM 600 600 MG Tabs tablet  Generic drug:  calcium carbonate  Take by mouth.     CALCIUM 600+D 600-200 MG-UNIT Tabs  Generic drug:  Calcium Carbonate-Vitamin D  Take by mouth. Reported on 08/25/2015     Calcium Citrate 200 MG  Tabs  Take 950 mg by mouth.     cetirizine 10 MG tablet  Commonly known as:  ZYRTEC  Take 10 mg by mouth daily.     conjugated estrogens vaginal cream  Commonly known as:  PREMARIN  Place 1 Applicatorful vaginally daily. Apply 0.5mg  (pea-sized amount)  just inside the vaginal introitus with a finger-tip every night for two weeks and then Monday, Wednesday and Friday nights.     fluticasone 50 MCG/ACT nasal spray  Commonly known as:  FLONASE  Place 2 sprays into the nose daily.     Levothyroxine Sodium 75 MCG Caps  Take by mouth daily before breakfast.     multivitamin tablet  Take 1 tablet by mouth daily.     Vitamin D3 2000 units capsule  Take by mouth.        Allergies:  Allergies  Allergen Reactions  . Bactrim [Sulfamethoxazole-Trimethoprim] Nausea And Vomiting  . Pollen Extract Other (See Comments)    respiratory  .  Sulfa Antibiotics Rash    And GI Upset  . Sulfamethoxazole-Trimethoprim Rash    Family History: Family History  Problem Relation Age of Onset  . Cancer Father     colon  . Kidney cancer Neg Hx   . Renal cancer Neg Hx     Social History:  reports that she has never smoked. She has never used smokeless tobacco. She reports that she does not drink alcohol or use illicit drugs.  ROS: UROLOGY Frequent Urination?: No Hard to postpone urination?: No Burning/pain with urination?: No Get up at night to urinate?: No Leakage of urine?: No Urine stream starts and stops?: No Trouble starting stream?: No Do you have to strain to urinate?: No Blood in urine?: No Urinary tract infection?: No Sexually transmitted disease?: No Injury to kidneys or bladder?: No Painful intercourse?: No Weak stream?: No Currently pregnant?: No Vaginal bleeding?: No Last menstrual period?: hysterctomy  Gastrointestinal Nausea?: No Vomiting?: No Indigestion/heartburn?: No Diarrhea?: No Constipation?: No  Constitutional Fever: No Night sweats?: No Weight loss?: No Fatigue?: No  Skin Skin rash/lesions?: No Itching?: No  Eyes Blurred vision?: No Double vision?: No  Ears/Nose/Throat Sore throat?: No Sinus problems?: No  Hematologic/Lymphatic Swollen glands?: No Easy bruising?: No  Cardiovascular Leg swelling?: No Chest pain?: No  Respiratory Cough?: No Shortness of breath?: No  Endocrine Excessive thirst?: No  Musculoskeletal Back pain?: No Joint pain?: No  Neurological Headaches?: No Dizziness?: No  Psychologic Depression?: No Anxiety?: No  Physical Exam: BP 149/81 mmHg  Pulse 79  Ht 5\' 2"  (1.575 m)  Wt 165 lb 1.6 oz (74.889 kg)  BMI 30.19 kg/m2  Constitutional: Well nourished. Alert and oriented, No acute distress. HEENT: [place] AT, moist mucus membranes. Trachea midline, no masses. Cardiovascular: No clubbing, cyanosis, or edema. Respiratory: Normal respiratory  effort, no increased work of breathing. GI: Abdomen is soft, non tender, non distended, no abdominal masses. Liver and spleen not palpable. No hernias appreciated. Stool sample for occult testing is not indicated.  GU: No CVA tenderness. No bladder fullness or masses. Atrophic external genitalia, normal pubic hair distribution, no lesions. Normal urethral meatus, no lesions, no prolapse, no discharge. No urethral masses, tenderness and/or tenderness. No bladder fullness, tenderness or masses. Atrophic vagina mucosa, poor estrogen effect, no discharge, no lesions, good pelvic support, Grade II cystocele is noted. No rectocele noted. Uterus and cervix are surgically absent. No pelvic masses or tenderness noted. Anus and perineum are without rashes or lesions.  Skin: No rashes,  bruises or suspicious lesions. Lymph: No cervical or inguinal adenopathy. Neurologic: Grossly intact, no focal deficits, moving all 4 extremities. Psychiatric: Normal mood and affect.  Laboratory Data: Lab Results  Component Value Date   WBC 7.9 06/30/2014   HGB 10.5* 07/01/2014   HCT 33.5* 06/30/2014   MCV 89 06/30/2014   PLT 174 06/30/2014    Lab Results  Component Value Date   CREATININE 0.57* 06/30/2014    Lab Results  Component Value Date   AST 40* 06/28/2014   Lab Results  Component Value Date   ALT 43 06/28/2014     Pertinent Imaging: Results for LIZMARI, TOOMEY (MRN [id number]) as of 09/08/2015 10:48  Ref. Range 09/08/2015 10:12  Scan Result Unknown 137ml     Assessment & Plan:    1. Urinary retention:    She has discontinued her oxybutynin.  Her PVR has decreased to 158 mL.  She will be RTC in three months.  We will recheck another PVR at that time.  2. Overflow incontinence:    Patient's urinary incontinence has improved since discontinuing her oxybutynin.  We will continue to monitor.  She will be returning in three months for a symptom recheck.  3. Atrophic vaginitis:    She is using the cream as prescribed.  We will examine her again in three months.    4. Cystocele:    She may benefit from a pessary fitting.  I will refer her to gynecology for a pessary fitting.  She is really wanting surgery as she believes it will relieve her incontinence.    5. UTI symptoms:  Resolved for now.   Return in about 3 months (around 12/06/2015) for exam and PVR .  These notes generated with voice recognition software. I apologize for typographical errors.  [name], [facility] [address] [place], [place] [address] [phone]

## 2015-09-08 NOTE — Patient Instructions (Signed)
Patient was given a sample of vaginal estrogen cream and instructed to apply 0.5mg (pea-sized amount)  just inside the vaginal introitus with a finger-tip every night for two weeks and then Monday, Wednesday and Friday nights.   

## 2015-10-13 DIAGNOSIS — M150 Primary generalized (osteo)arthritis: Secondary | ICD-10-CM | POA: Diagnosis not present

## 2015-10-13 DIAGNOSIS — E782 Mixed hyperlipidemia: Secondary | ICD-10-CM | POA: Diagnosis not present

## 2015-10-13 DIAGNOSIS — M3320 Polymyositis, organ involvement unspecified: Secondary | ICD-10-CM | POA: Diagnosis not present

## 2015-10-13 DIAGNOSIS — E039 Hypothyroidism, unspecified: Secondary | ICD-10-CM | POA: Diagnosis not present

## 2015-10-13 DIAGNOSIS — Z0000 Encounter for general adult medical examination without abnormal findings: Secondary | ICD-10-CM | POA: Diagnosis not present

## 2015-10-13 DIAGNOSIS — I10 Essential (primary) hypertension: Secondary | ICD-10-CM | POA: Diagnosis not present

## 2015-10-20 DIAGNOSIS — E782 Mixed hyperlipidemia: Secondary | ICD-10-CM | POA: Diagnosis not present

## 2015-10-20 DIAGNOSIS — M3320 Polymyositis, organ involvement unspecified: Secondary | ICD-10-CM | POA: Diagnosis not present

## 2015-10-20 DIAGNOSIS — E039 Hypothyroidism, unspecified: Secondary | ICD-10-CM | POA: Diagnosis not present

## 2015-10-20 DIAGNOSIS — I10 Essential (primary) hypertension: Secondary | ICD-10-CM | POA: Diagnosis not present

## 2015-10-22 ENCOUNTER — Ambulatory Visit (INDEPENDENT_AMBULATORY_CARE_PROVIDER_SITE_OTHER): Payer: Medicare Other | Admitting: Obstetrics and Gynecology

## 2015-10-22 ENCOUNTER — Encounter: Payer: Self-pay | Admitting: Obstetrics and Gynecology

## 2015-10-22 ENCOUNTER — Encounter: Payer: Medicare Other | Admitting: Obstetrics and Gynecology

## 2015-10-22 VITALS — BP 108/71 | HR 84 | Ht 62.0 in | Wt 164.4 lb

## 2015-10-22 DIAGNOSIS — Z87440 Personal history of urinary (tract) infections: Secondary | ICD-10-CM | POA: Diagnosis not present

## 2015-10-22 DIAGNOSIS — N816 Rectocele: Secondary | ICD-10-CM

## 2015-10-22 DIAGNOSIS — N8110 Cystocele, unspecified: Secondary | ICD-10-CM | POA: Diagnosis not present

## 2015-10-22 DIAGNOSIS — N952 Postmenopausal atrophic vaginitis: Secondary | ICD-10-CM

## 2015-10-22 DIAGNOSIS — Z87448 Personal history of other diseases of urinary system: Secondary | ICD-10-CM

## 2015-10-22 DIAGNOSIS — Z87898 Personal history of other specified conditions: Secondary | ICD-10-CM

## 2015-10-25 NOTE — Progress Notes (Signed)
GYNECOLOGY CLINIC PROGRESS NOTE   Subjective:    Cassidy Bell is a 75 y.o. female who presents for evaluation of a cystocele and pessary evaluation.  She has been referred from [place]. Problem started several years ago. Symptoms include: frequent urination, nocturia, and urinary leakage. Patient also with recent UTI in January (denies h/o recurrent UTIs).  Of note, patient had been on oxybutynin for urgency, however was noted to have urinary retention and overflow incontinence at most recent visit at [facility] (with ~ 700 ml noted on catheterization). Was discontinued from medication.  Had to self cath for 1 week.  Patient now notes that symptoms have resolved and she is emptying bladder normally. Symptoms have progressed to a point and plateaued.  Of note, patient has a h/o bladder tack in the past.   Menstrual History: Menarche age: 4  No LMP recorded. Patient has had a hysterectomy.    Past Medical History  Diagnosis Date  . Hypertension   . Neuromuscular disorder ([place])   . Arthritis   . Arthritis   . Bladder prolapse, female, acquired     Family History  Problem Relation Age of Onset  . Cancer Father     colon  . Kidney cancer Neg Hx   . Renal cancer Neg Hx     Past Surgical History  Procedure Laterality Date  . Colonoscopy    . Carpal tunnel release    . Abdominal hysterectomy    . Muscle biopsy    . Replacement total hip w/  resurfacing implants  1996    Right & Left   . Bladder tach  1993    Social History   Social History  . Marital Status: Widowed    Spouse Name: N/A  . Number of Children: N/A  . Years of Education: N/A   Occupational History  . Not on file.   Social History Main Topics  . Smoking status: Never Smoker   . Smokeless tobacco: Never Used  . Alcohol Use: No  . Drug Use: No  . Sexual Activity: No   Other Topics Concern  . Not on file   Social History Narrative    . Current Outpatient Prescriptions on  File Prior to Visit  Medication Sig Dispense Refill  . aspirin 81 MG tablet Take 81 mg by mouth daily.    . benazepril (LOTENSIN) 10 MG tablet Take 10 mg by mouth daily.    . calcium carbonate (CALCIUM 600) 600 MG TABS tablet Take by mouth.    . Calcium Carbonate-Vitamin D (CALCIUM 600 + D PO) Take by mouth. Reported on 08/25/2015    . Calcium Carbonate-Vitamin D (CALCIUM 600+D) 600-200 MG-UNIT TABS Take by mouth. Reported on 08/25/2015    . Calcium Citrate 200 MG TABS Take 950 mg by mouth.    . cetirizine (ZYRTEC) 10 MG tablet Take 10 mg by mouth daily.    . Cholecalciferol (VITAMIN D3) 2000 units capsule Take by mouth.    . conjugated estrogens (PREMARIN) vaginal cream Place 1 Applicatorful vaginally daily. Apply 0.5mg  (pea-sized amount)  just inside the vaginal introitus with a finger-tip every night for two weeks and then Monday, Wednesday and Friday nights. 30 g 12  . fluticasone (FLONASE) 50 MCG/ACT nasal spray Place 2 sprays into the nose daily.    . Levothyroxine Sodium 75 MCG CAPS Take by mouth daily before breakfast.    . Multiple Vitamin (MULTIVITAMIN) tablet Take 1 tablet by mouth daily.  No current facility-administered medications on file prior to visit.    Allergies  Allergen Reactions  . Bactrim [Sulfamethoxazole-Trimethoprim] Nausea And Vomiting  . Pollen Extract Other (See Comments)    respiratory  . Sulfa Antibiotics Rash    And GI Upset  . Sulfamethoxazole-Trimethoprim Rash     Review of Systems Pertinent items noted in HPI and remainder of comprehensive ROS otherwise negative.   Objective:     BP 108/71 mmHg  Pulse 84  Ht 5\' 2"  (1.575 m)  Wt 164 lb 6.4 oz (74.571 kg)  BMI 30.06 kg/m2   General Appearance:  alert and no distress; obese  Abdomen:    Soft, nontender, no masses or organomegaly  Pelvis:  External genitalia: normal general appearance Urinary system: urethral meatus normal and bladder not palpable Vaginal: atrophic mucosa, cystocele  present, 2nd degree and rectocele present, 1st degree Cervix: removed surgically Adnexa: non palpable and no tenderness bilaterally Uterus: removed surgically Rectal: good sphincter tone and no masses  Extremities:   nontender, no erythema or edema  Neurologic:   Grossly intact       Assessment:    The patient has a cystocele and rectocele   Atrophic vagina H/o urinary retention with overflow incontinence H/o UTI  Plan:    Discussed cystoceles/rectoceles and management options with the patient. All questions answered. Pessary fitting performed today.  Will order size 3 ring with support.  Continue use of premarin cream for vaginal atrophy. H/o urinary retention with overflow incontinence - improved. Continue to hold oxybutynin. Will f/u with [facility] in 2 months.  H/o UTI - resolved for now.  Follow up in 2 weeks for pessary insertion.     Rubie Maid, MD [facility]

## 2015-12-01 ENCOUNTER — Ambulatory Visit (INDEPENDENT_AMBULATORY_CARE_PROVIDER_SITE_OTHER): Payer: Medicare Other | Admitting: Obstetrics and Gynecology

## 2015-12-01 VITALS — BP 124/76 | HR 89 | Ht 62.0 in | Wt 166.8 lb

## 2015-12-01 DIAGNOSIS — N816 Rectocele: Secondary | ICD-10-CM

## 2015-12-01 DIAGNOSIS — R3915 Urgency of urination: Secondary | ICD-10-CM | POA: Diagnosis not present

## 2015-12-01 DIAGNOSIS — Z960 Presence of urogenital implants: Secondary | ICD-10-CM | POA: Insufficient documentation

## 2015-12-01 DIAGNOSIS — N8110 Cystocele, unspecified: Secondary | ICD-10-CM | POA: Diagnosis not present

## 2015-12-01 DIAGNOSIS — Z9289 Personal history of other medical treatment: Secondary | ICD-10-CM

## 2015-12-01 DIAGNOSIS — N39490 Overflow incontinence: Secondary | ICD-10-CM

## 2015-12-01 DIAGNOSIS — N952 Postmenopausal atrophic vaginitis: Secondary | ICD-10-CM

## 2015-12-01 NOTE — Progress Notes (Signed)
    GYNECOLOGY PROGRESS NOTE  Subjective:    Patient ID: Cassidy Bell, female    DOB: [birth year], 75 y.o.   MRN: [id number]  HPI  Patient is a 75 y.o. female who presents for pessary insertion for cystocele and rectocele, urinary retention with overflow incontinence.  Denies complaints today.    The following portions of the patient's history were reviewed and updated as appropriate: allergies, current medications, past family history, past medical history, past social history, past surgical history and problem list.  Review of Systems A comprehensive review of systems was negative.   Objective:   Blood pressure 124/76, pulse 89, height 5\' 2"  (1.575 m), weight 166 lb 12.8 oz (75.66 kg). General appearance: alert and no distress Abdomen: soft, non-tender; bowel sounds normal; no masses,  no organomegaly Pelvic: External genitalia: normal general appearance  Vaginal: atrophic mucosa, cystocele present, 2nd degree and rectocele present, 1st degree  Cervix: removed surgically  Adnexa: non palpable and no tenderness bilaterally  Uterus: removed surgically Extremities: extremities normal, atraumatic, no cyanosis or edema Neurologic: Grossly normal   Assessment:   cystocele and rectocele  Atrophic vagina H/o urinary retention with overflow incontinence Urinary urgency H/o UTI  Plan:   Continue use of premarin cream for vaginal atrophy. Previously on oxybutynin which was causing urinary retention.  Doing well still not taking medication. Continue to hold.   Size 3 incontinence dish with knob inserted today.  To RTC in 2 weeks to address any problems or concerns and for pessary check. DIscussed precautions with worsening urinary retention.  No further UTI symptoms today.    Rubie Maid, MD [facility]

## 2015-12-03 ENCOUNTER — Telehealth: Payer: Self-pay | Admitting: Obstetrics and Gynecology

## 2015-12-03 DIAGNOSIS — H2513 Age-related nuclear cataract, bilateral: Secondary | ICD-10-CM | POA: Diagnosis not present

## 2015-12-03 NOTE — Telephone Encounter (Signed)
Patient scheduled for a pessary fitting on 12/31/15.[facility]

## 2015-12-03 NOTE — Telephone Encounter (Signed)
Patient called stating that her pessary fell out. Please Advise

## 2015-12-03 NOTE — Telephone Encounter (Signed)
Please call pt and have her scheduled for another pessary fitting, thanks

## 2015-12-08 ENCOUNTER — Encounter: Payer: Self-pay | Admitting: Urology

## 2015-12-08 ENCOUNTER — Ambulatory Visit (INDEPENDENT_AMBULATORY_CARE_PROVIDER_SITE_OTHER): Payer: Medicare Other | Admitting: Urology

## 2015-12-08 VITALS — BP 129/75 | HR 70 | Ht 62.0 in | Wt 165.1 lb

## 2015-12-08 DIAGNOSIS — R339 Retention of urine, unspecified: Secondary | ICD-10-CM | POA: Diagnosis not present

## 2015-12-08 DIAGNOSIS — N39490 Overflow incontinence: Secondary | ICD-10-CM | POA: Diagnosis not present

## 2015-12-08 DIAGNOSIS — N8110 Cystocele, unspecified: Secondary | ICD-10-CM | POA: Diagnosis not present

## 2015-12-08 DIAGNOSIS — IMO0001 Reserved for inherently not codable concepts without codable children: Secondary | ICD-10-CM

## 2015-12-08 DIAGNOSIS — N952 Postmenopausal atrophic vaginitis: Secondary | ICD-10-CM | POA: Diagnosis not present

## 2015-12-08 DIAGNOSIS — IMO0002 Reserved for concepts with insufficient information to code with codable children: Secondary | ICD-10-CM

## 2015-12-08 LAB — BLADDER SCAN AMB NON-IMAGING: Scan Result: 328

## 2015-12-08 NOTE — Progress Notes (Signed)
11:03 AM   Cassidy Bell [birth year] [id number]  Referring provider: Merrilee Seashore, MD [address] [place], [place] [address]  Chief Complaint  Patient presents with  . Urinary Retention    3 month follow up  . Vaginitis    HPI: Patient is a 75 year old Caucasian female who presents today for 3 month follow-up for urinary retention, overflow incontinence, atrophic vaginitis and cystocele.    Overflow incontinence Patient was found to have overflow incontinence and a PVR of over 600 cc who had a foley placed for bladder decompression for one week.  She was also advised to discontinue the oxybutynin.  She is voiding successfully on her own.  Her PVR today is 328 mL. Her former PVR was 158 mL at her last visit.  She is currently working with gynecology on a pessary fitting for her cystocele. She states she is actually surprised as her incontinence is much less.  She is still experiencing incontinence but not as often or as large of volume as in the past.    Urinary retention Patient is now voiding well on her own.  Bladder prolapse Patient states she has been experiencing frequent urination, nocturia and leakage of urine for the last several years. She states now that she just leaks without any warning.  She had been on oxybutynin for her urinary incontinence for approximately 4 years. She is no longer finding it effective.  Her PVR at her last visit was 664 mL and a foley was placed for decompression.  She was instructed to discontinue her oxybutynin at her last visit as well.  She has not taken the oxybutynin since 08/18/2015.  She is really pressing for a bladder tacking.  She did admit to an improvement with her incontinence after stopping the oxybutynin.  Her PVR today was 328 mL, which is increased from her last visit from 158 mL.   She is currently working with Dr. Marcelline Mates at [facility] for a pessary fitting.   Her initial pessary fell out when she returned  home and she has a follow-up appointment for another fitting in the near future.  PMH: Past Medical History  Diagnosis Date  . Hypertension   . Neuromuscular disorder ([place])   . Arthritis   . Arthritis   . Bladder prolapse, female, acquired   . Urinary retention   . Atrophic vaginitis     Surgical History: Past Surgical History  Procedure Laterality Date  . Colonoscopy    . Carpal tunnel release    . Abdominal hysterectomy    . Muscle biopsy    . Replacement total hip w/  resurfacing implants  1996    Right & Left   . Bladder tach  1993    Home Medications:    Medication List       This list is accurate as of: 12/08/15 11:59 PM.  Always use your most recent med list.               amoxicillin 500 MG capsule  Commonly known as:  AMOXIL  Reported on 12/08/2015     aspirin 81 MG tablet  Take 81 mg by mouth daily.     benazepril 10 MG tablet  Commonly known as:  LOTENSIN  Take 10 mg by mouth daily.     CALCIUM 600 + D PO  Take by mouth. Reported on 08/25/2015     CALCIUM 600 600 MG Tabs tablet  Generic drug:  calcium carbonate  Take by mouth.     CALCIUM 600+D 600-200 MG-UNIT Tabs  Generic drug:  Calcium Carbonate-Vitamin D  Take by mouth. Reported on 12/08/2015     Calcium Citrate 200 MG Tabs  Take 950 mg by mouth. Reported on 12/08/2015     cetirizine 10 MG tablet  Commonly known as:  ZYRTEC  Take 10 mg by mouth daily.     conjugated estrogens vaginal cream  Commonly known as:  PREMARIN  Place 1 Applicatorful vaginally daily. Apply 0.5mg  (pea-sized amount)  just inside the vaginal introitus with a finger-tip every night for two weeks and then Monday, Wednesday and Friday nights.     fluticasone 50 MCG/ACT nasal spray  Commonly known as:  FLONASE  Place 2 sprays into the nose daily.     Levothyroxine Sodium 75 MCG Caps  Take by mouth daily before breakfast.     multivitamin tablet  Take 1 tablet by mouth daily.     Vitamin D3 2000 units capsule    Take by mouth.        Allergies:  Allergies  Allergen Reactions  . Bactrim [Sulfamethoxazole-Trimethoprim] Nausea And Vomiting  . Pollen Extract Other (See Comments)    respiratory  . Sulfa Antibiotics Rash    And GI Upset  . Sulfamethoxazole-Trimethoprim Rash    Family History: Family History  Problem Relation Age of Onset  . Cancer Father     colon  . Kidney cancer Neg Hx   . Renal cancer Neg Hx     Social History:  reports that she has never smoked. She has never used smokeless tobacco. She reports that she does not drink alcohol or use illicit drugs.  ROS: UROLOGY Frequent Urination?: No Hard to postpone urination?: No Burning/pain with urination?: No Get up at night to urinate?: No Leakage of urine?: No Urine stream starts and stops?: No Trouble starting stream?: No Do you have to strain to urinate?: No Blood in urine?: No Urinary tract infection?: No Sexually transmitted disease?: No Injury to kidneys or bladder?: No Painful intercourse?: No Weak stream?: No Currently pregnant?: No Vaginal bleeding?: No Last menstrual period?: n  Gastrointestinal Nausea?: No Vomiting?: No Indigestion/heartburn?: No Diarrhea?: No Constipation?: No  Constitutional Fever: No Night sweats?: No Weight loss?: No Fatigue?: No  Skin Skin rash/lesions?: No Itching?: No  Eyes Blurred vision?: No Double vision?: No  Ears/Nose/Throat Sore throat?: No Sinus problems?: No  Hematologic/Lymphatic Swollen glands?: No Easy bruising?: No  Cardiovascular Leg swelling?: No Chest pain?: No  Respiratory Cough?: No Shortness of breath?: No  Endocrine Excessive thirst?: No  Musculoskeletal Back pain?: No Joint pain?: No  Neurological Headaches?: No Dizziness?: No  Psychologic Depression?: No Anxiety?: No  Physical Exam: BP 129/75 mmHg  Pulse 70  Ht 5\' 2"  (1.575 m)  Wt 165 lb 1.6 oz (74.889 kg)  BMI 30.19 kg/m2  Constitutional: Well nourished.  Alert and oriented, No acute distress. HEENT: [place] AT, moist mucus membranes. Trachea midline, no masses. Cardiovascular: No clubbing, cyanosis, or edema. Respiratory: Normal respiratory effort, no increased work of breathing. GI: Abdomen is soft, non tender, non distended, no abdominal masses. Liver and spleen not palpable. No hernias appreciated. Stool sample for occult testing is not indicated.  GU: No CVA tenderness. No bladder fullness or masses. Atrophic external genitalia, normal pubic hair distribution, no lesions. Normal urethral meatus, no lesions, no prolapse, no discharge. No urethral masses, tenderness and/or tenderness. No bladder fullness, tenderness or masses. Atrophic vagina mucosa, poor estrogen effect, no  discharge, no lesions, good pelvic support, Grade II cystocele is noted. No rectocele noted. Uterus and cervix are surgically absent. No pelvic masses or tenderness noted. Anus and perineum are without rashes or lesions.  Skin: No rashes, bruises or suspicious lesions. Lymph: No cervical or inguinal adenopathy. Neurologic: Grossly intact, no focal deficits, moving all 4 extremities. Psychiatric: Normal mood and affect.  Laboratory Data: Lab Results  Component Value Date   WBC 7.9 06/30/2014   HGB 10.5* 07/01/2014   HCT 33.5* 06/30/2014   MCV 89 06/30/2014   PLT 174 06/30/2014    Lab Results  Component Value Date   CREATININE 0.57* 06/30/2014    Lab Results  Component Value Date   AST 40* 06/28/2014   Lab Results  Component Value Date   ALT 43 06/28/2014     Pertinent Imaging: Results for Cassidy Bell, Cassidy Bell (MRN [id number]) as of 12/08/2015 10:38  Ref. Range 12/08/2015 10:28  Scan Result Unknown 328    Assessment & Plan:    1. Urinary retention:    Her PVR today was 320 mL.  She is able to void on her own.  I believe once that she can find a pessary that fits her properly, her residuals will decrease even further.  2. Overflow  incontinence:    Patient's urinary incontinence has improved since discontinuing her oxybutynin.  We will continue to monitor.  She will be returning in 1 year for a symptom recheck and PVR.    3. Atrophic vaginitis:   She is using the cream 3 nights weekly, she will return in 1 year for vaginal exam and symptom recheck.  4. Cystocele:    She has an upcoming appointment for a pessary with Dr. Marcelline Mates.   Return in about 1 year (around 12/07/2016) for exam .  These notes generated with voice recognition software. I apologize for typographical errors.  [name], [facility] [address] [place], [place] [address] [phone]

## 2015-12-09 ENCOUNTER — Other Ambulatory Visit: Payer: Self-pay | Admitting: Internal Medicine

## 2015-12-09 ENCOUNTER — Ambulatory Visit
Admission: RE | Admit: 2015-12-09 | Discharge: 2015-12-09 | Disposition: A | Discharge status: Home / Self Care | Payer: Medicare Other | Source: Ambulatory Visit | Attending: Internal Medicine | Admitting: Internal Medicine

## 2015-12-09 DIAGNOSIS — Z1231 Encounter for screening mammogram for malignant neoplasm of breast: Secondary | ICD-10-CM | POA: Diagnosis not present

## 2015-12-23 ENCOUNTER — Ambulatory Visit: Payer: Medicare Other | Admitting: Obstetrics and Gynecology

## 2015-12-28 DIAGNOSIS — M19211 Secondary osteoarthritis, right shoulder: Secondary | ICD-10-CM | POA: Diagnosis not present

## 2015-12-28 DIAGNOSIS — M87112 Osteonecrosis due to drugs, left shoulder: Secondary | ICD-10-CM | POA: Diagnosis not present

## 2015-12-28 DIAGNOSIS — M19212 Secondary osteoarthritis, left shoulder: Secondary | ICD-10-CM | POA: Diagnosis not present

## 2015-12-28 DIAGNOSIS — T380X1A Poisoning by glucocorticoids and synthetic analogues, accidental (unintentional), initial encounter: Secondary | ICD-10-CM | POA: Diagnosis not present

## 2015-12-28 DIAGNOSIS — M87111 Osteonecrosis due to drugs, right shoulder: Secondary | ICD-10-CM | POA: Diagnosis not present

## 2015-12-31 ENCOUNTER — Encounter: Payer: Self-pay | Admitting: Obstetrics and Gynecology

## 2015-12-31 ENCOUNTER — Ambulatory Visit (INDEPENDENT_AMBULATORY_CARE_PROVIDER_SITE_OTHER): Payer: Medicare Other | Admitting: Obstetrics and Gynecology

## 2015-12-31 VITALS — BP 113/69 | HR 71 | Ht 62.0 in | Wt 166.5 lb

## 2015-12-31 DIAGNOSIS — R32 Unspecified urinary incontinence: Secondary | ICD-10-CM | POA: Diagnosis not present

## 2015-12-31 DIAGNOSIS — Z4689 Encounter for fitting and adjustment of other specified devices: Secondary | ICD-10-CM

## 2015-12-31 DIAGNOSIS — N8110 Cystocele, unspecified: Secondary | ICD-10-CM | POA: Diagnosis not present

## 2015-12-31 DIAGNOSIS — N952 Postmenopausal atrophic vaginitis: Secondary | ICD-10-CM

## 2015-12-31 DIAGNOSIS — N816 Rectocele: Secondary | ICD-10-CM

## 2015-12-31 NOTE — Progress Notes (Signed)
    GYNECOLOGY PROGRESS NOTE  Subjective:    Patient ID: Cassidy Bell, female    DOB: 1940/10/16, 75 y.o.   MRN: [id number]  HPI  Patient is a 75 y.o. female who presents for pessary re-evaluation/re-fitting for cystocele and rectocele, urinary retention with overflow incontinence. Patient previously fitted for pessary, was ordered size 3 ring with support.  Patient notes that the pessary came out after 1 day (noted shortly after having a BM).   The following portions of the patient's history were reviewed and updated as appropriate: allergies, current medications, past family history, past medical history, past social history, past surgical history and problem list.  Review of Systems A comprehensive review of systems was negative.   Objective:   Blood pressure 113/69, pulse 71, height 5\' 2"  (1.575 m), weight 166 lb 8 oz (75.524 kg). 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 Will order Size 4 ring with support pessary.  F/u in 1-2 weeks for pessary insertion.     Rubie Maid, MD [facility]

## 2016-01-22 ENCOUNTER — Telehealth: Payer: Self-pay | Admitting: *Deleted

## 2016-01-22 NOTE — Telephone Encounter (Signed)
Please inform this pt that we do have her pessary, she can be added to the schedule in the first available slot for pessary insertion. Please advise pt that the provider is on vacation soon and appt may be out some. Thanks

## 2016-01-22 NOTE — Telephone Encounter (Signed)
Patient called and asked if her pessary was back. Patient is requesting a call back. Her call back number is [phone]. Thanks

## 2016-01-22 NOTE — Telephone Encounter (Signed)
Pt is aware and is schedule for an appt on 02/23/16.

## 2016-02-08 DIAGNOSIS — Z96641 Presence of right artificial hip joint: Secondary | ICD-10-CM | POA: Diagnosis not present

## 2016-02-08 DIAGNOSIS — I10 Essential (primary) hypertension: Secondary | ICD-10-CM | POA: Diagnosis not present

## 2016-02-08 DIAGNOSIS — Z882 Allergy status to sulfonamides status: Secondary | ICD-10-CM | POA: Diagnosis not present

## 2016-02-08 DIAGNOSIS — E039 Hypothyroidism, unspecified: Secondary | ICD-10-CM | POA: Diagnosis not present

## 2016-02-08 DIAGNOSIS — Z471 Aftercare following joint replacement surgery: Secondary | ICD-10-CM | POA: Diagnosis not present

## 2016-02-08 DIAGNOSIS — Z96643 Presence of artificial hip joint, bilateral: Secondary | ICD-10-CM | POA: Diagnosis not present

## 2016-02-23 ENCOUNTER — Ambulatory Visit (INDEPENDENT_AMBULATORY_CARE_PROVIDER_SITE_OTHER): Payer: Medicare Other | Admitting: Obstetrics and Gynecology

## 2016-02-23 ENCOUNTER — Encounter: Payer: Self-pay | Admitting: Obstetrics and Gynecology

## 2016-02-23 VITALS — BP 119/79 | HR 88 | Ht 62.0 in | Wt 165.3 lb

## 2016-02-23 DIAGNOSIS — R339 Retention of urine, unspecified: Secondary | ICD-10-CM

## 2016-02-23 DIAGNOSIS — N816 Rectocele: Secondary | ICD-10-CM

## 2016-02-23 DIAGNOSIS — Z87440 Personal history of urinary (tract) infections: Secondary | ICD-10-CM | POA: Diagnosis not present

## 2016-02-23 DIAGNOSIS — N952 Postmenopausal atrophic vaginitis: Secondary | ICD-10-CM

## 2016-02-23 DIAGNOSIS — N39490 Overflow incontinence: Secondary | ICD-10-CM | POA: Diagnosis not present

## 2016-02-23 DIAGNOSIS — R3915 Urgency of urination: Secondary | ICD-10-CM

## 2016-02-23 DIAGNOSIS — N8110 Cystocele, unspecified: Secondary | ICD-10-CM

## 2016-02-23 DIAGNOSIS — Z4689 Encounter for fitting and adjustment of other specified devices: Secondary | ICD-10-CM | POA: Diagnosis not present

## 2016-02-23 NOTE — Progress Notes (Signed)
    GYNECOLOGY PROGRESS NOTE  Subjective:    Patient ID: Cassidy Bell, female    DOB: [birth year], 75 y.o.   MRN: [id number]  HPI  Patient is a 75 y.o. female who presents for pessary  insertion of new pessary. Patient was reevaluated and refitted last visit due to expulsion of original pessary (previously fitted for size 3 ring with support, changed to a size 4 ring with support this visit).  Patient's symptoms include: cystocele and rectocele, urinary retention with overflow incontinence. Patient previously fitted for pessary, was ordered size 3 ring with support.  Patient notes that the pessary came out after 1 day (noted shortly after having a BM).   The following portions of the patient's history were reviewed and updated as appropriate: allergies, current medications, past family history, past medical history, past social history, past surgical history and problem list.  Review of Systems A comprehensive review of systems was negative.   Objective:   Blood pressure 119/79, pulse 88, height 5\' 2"  (1.575 m), weight 165 lb 4.8 oz (75 kg). 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 Size 4 ring with support inserted today however after patient ambulated for several minutes, she felt as if the current pessary was also about to be expelled. Refitted today  with several different types of pessaries. Will order size 3 Gellhorn pessary with a short stem. Discussed possible need for surgical management if no pessary was able to provide support needed. F/u in 1-2 weeks for pessary insertion.     Rubie Maid, MD [facility]

## 2016-03-03 DIAGNOSIS — N329 Bladder disorder, unspecified: Secondary | ICD-10-CM | POA: Diagnosis not present

## 2016-03-03 DIAGNOSIS — Z1389 Encounter for screening for other disorder: Secondary | ICD-10-CM | POA: Diagnosis not present

## 2016-03-03 DIAGNOSIS — N390 Urinary tract infection, site not specified: Secondary | ICD-10-CM | POA: Diagnosis not present

## 2016-03-15 ENCOUNTER — Ambulatory Visit (INDEPENDENT_AMBULATORY_CARE_PROVIDER_SITE_OTHER): Payer: Medicare Other | Admitting: Obstetrics and Gynecology

## 2016-03-15 VITALS — BP 89/55 | HR 92 | Ht 62.0 in | Wt 166.8 lb

## 2016-03-15 DIAGNOSIS — N816 Rectocele: Secondary | ICD-10-CM | POA: Diagnosis not present

## 2016-03-15 DIAGNOSIS — N8110 Cystocele, unspecified: Secondary | ICD-10-CM

## 2016-03-15 DIAGNOSIS — R3915 Urgency of urination: Secondary | ICD-10-CM

## 2016-03-15 DIAGNOSIS — N952 Postmenopausal atrophic vaginitis: Secondary | ICD-10-CM

## 2016-03-15 DIAGNOSIS — I950 Idiopathic hypotension: Secondary | ICD-10-CM

## 2016-03-15 DIAGNOSIS — Z4689 Encounter for fitting and adjustment of other specified devices: Secondary | ICD-10-CM

## 2016-03-15 NOTE — Progress Notes (Signed)
    GYNECOLOGY PROGRESS NOTE  Subjective:    Patient ID: Cassidy Bell, female    DOB: 01-30-1941, 75 y.o.   MRN: [id number]  HPI  Patient is a 75 y.o. female who presents for insertion of new pessary. Patient was reevaluated and refitted last visit due to expulsion of original pessary (size 4 ring with support).  Patient's symptoms include: cystocele and rectocele, urinary retention with overflow incontinence.   The following portions of the patient's history were reviewed and updated as appropriate: allergies, current medications, past family history, past medical history, past social history, past surgical history and problem list.  Review of Systems A comprehensive review of systems was negative.   Objective:   Blood pressure (!) 89/55, pulse 92, height 5\' 2"  (1.575 m), weight 166 lb 12.8 oz (75.7 kg). 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 Size 3 Gellhorn pessary with a short stem inserted today. Reiterated possible need for surgical management if no pessary was able to provide support needed. F/u in 8 weeks for pessary check.   Low BP today, patient currently asymptomatic.  Will monitor.    Rubie Maid, MD [facility]

## 2016-03-16 ENCOUNTER — Telehealth: Payer: Self-pay | Admitting: Obstetrics and Gynecology

## 2016-03-16 DIAGNOSIS — N8110 Cystocele, unspecified: Secondary | ICD-10-CM

## 2016-03-16 NOTE — Telephone Encounter (Signed)
Please advise 

## 2016-03-16 NOTE — Telephone Encounter (Signed)
Her pessary fell out because her pee built up and when she pushed it came out. She wanted to know if she can have it put back in. I wasn't sure if she needed a different size?

## 2016-03-16 NOTE — Telephone Encounter (Signed)
The issue with a bigger size is that if she had urine blockage with this one, a bigger one might make her problem worse and it may be more uncomfortable for her.  We can try to fit her in sometime tomorrow to reinsert it (and we can also check the office supply tomorrow to see if we have any spare ones available in the next size up).

## 2016-03-21 NOTE — Telephone Encounter (Signed)
Called pt she states that the pessary that was inserted was very uncomfortable due to stem, pt also notes that she was not able to void with pessary in place. Pt removed pessary and states that "the holes of the pessary were filled with lubricant" and believes this also blocked urine flow. Pt states that she is ready to look into surgical options. Will place referral for urology.

## 2016-03-25 ENCOUNTER — Other Ambulatory Visit: Payer: Self-pay

## 2016-04-13 ENCOUNTER — Encounter: Payer: Medicare Other | Admitting: Obstetrics and Gynecology

## 2016-04-18 ENCOUNTER — Ambulatory Visit: Payer: Medicare Other

## 2016-04-19 DIAGNOSIS — Z23 Encounter for immunization: Secondary | ICD-10-CM | POA: Diagnosis not present

## 2016-05-03 DIAGNOSIS — E039 Hypothyroidism, unspecified: Secondary | ICD-10-CM | POA: Diagnosis not present

## 2016-05-03 DIAGNOSIS — I10 Essential (primary) hypertension: Secondary | ICD-10-CM | POA: Diagnosis not present

## 2016-05-03 DIAGNOSIS — E782 Mixed hyperlipidemia: Secondary | ICD-10-CM | POA: Diagnosis not present

## 2016-05-10 ENCOUNTER — Ambulatory Visit: Payer: Medicare Other | Admitting: Obstetrics and Gynecology

## 2016-05-17 DIAGNOSIS — E782 Mixed hyperlipidemia: Secondary | ICD-10-CM | POA: Diagnosis not present

## 2016-05-17 DIAGNOSIS — I10 Essential (primary) hypertension: Secondary | ICD-10-CM | POA: Diagnosis not present

## 2016-05-17 DIAGNOSIS — E039 Hypothyroidism, unspecified: Secondary | ICD-10-CM | POA: Diagnosis not present

## 2016-05-17 DIAGNOSIS — M3320 Polymyositis, organ involvement unspecified: Secondary | ICD-10-CM | POA: Diagnosis not present

## 2016-06-06 ENCOUNTER — Encounter: Payer: Self-pay | Admitting: Urology

## 2016-06-06 ENCOUNTER — Ambulatory Visit (INDEPENDENT_AMBULATORY_CARE_PROVIDER_SITE_OTHER): Payer: Medicare Other | Admitting: Urology

## 2016-06-06 VITALS — BP 124/74 | HR 89 | Ht 62.0 in | Wt 169.0 lb

## 2016-06-06 DIAGNOSIS — N3946 Mixed incontinence: Secondary | ICD-10-CM | POA: Diagnosis not present

## 2016-06-06 MED ORDER — SOLIFENACIN SUCCINATE 5 MG PO TABS: 5.0000 mg | ORAL_TABLET | Freq: Every day | ORAL | 12 refills | Status: DC

## 2016-06-06 NOTE — Progress Notes (Signed)
06/06/2016 11:04 AM   Cassidy Bell [birth year] [id number]  Referring provider: Merrilee Seashore, MD [address] [place], [place] [address]  Chief Complaint  Patient presents with  . Follow-up    discuss surgery    HPI: Cassidy Bell May 2017 Patient was found to have overflow incontinence and a PVR of over 600 cc who had a foley placed for bladder decompression for one week.  She was also advised to discontinue the oxybutynin.  She is voiding successfully on her own.  Her PVR today is 328 mL. Her former PVR was 158 mL at her last visit.  She is currently working with gynecology on a pessary fitting for her cystocele. She states she is actually surprised as her incontinence is much less.  She is still experiencing incontinence but not as often or as large of volume as in the past.    Today The patient has urgency incontinence. She sometimes leaks with coughing and sneezing but not bending and lifting. She has mild enuresis. She voids every 1 hour and gets up once at night. She wears 1-2 pads per day. The volume loss is larger if she holds it too long.  She can feel and see prolapse and sometimes reduces it. She does no splinting maneuvers. She has had a previous bladder suspension. He no longer has a pessary  Infrequently she gets a urinary tract infection with typical cystitis symptoms.  She has no neurologic issues. Her bowel movements are normal.  Modifying factors: There are no other modifying factors  Associated signs and symptoms: There are no other associated signs and symptoms Aggravating and relieving factors: There are no other aggravating or relieving factors Severity: Moderate Duration: Persistent   PMH: Past Medical History:  Diagnosis Date  . Arthritis   . Arthritis   . Atrophic vaginitis   . Bladder prolapse, female, acquired   . Hypertension   . Neuromuscular disorder ([place])   . Urinary retention     Surgical History: Past Surgical  History:  Procedure Laterality Date  . ABDOMINAL HYSTERECTOMY    . bladder tach  1993  . CARPAL TUNNEL RELEASE    . COLONOSCOPY    . MUSCLE BIOPSY    . REPLACEMENT TOTAL HIP W/  RESURFACING IMPLANTS  1996   Right & Left     Home Medications:    Medication List       Accurate as of 06/06/16 11:04 AM. Always use your most recent med list.          acetaminophen 500 MG tablet Commonly known as:  TYLENOL Take 1,000 mg by mouth.   aspirin 81 MG tablet Take 81 mg by mouth daily.   benazepril 10 MG tablet Commonly known as:  LOTENSIN Take 10 mg by mouth daily.   CALCIUM 600 + D PO Take by mouth. Reported on 08/25/2015   cetirizine 10 MG tablet Commonly known as:  ZYRTEC Take 10 mg by mouth daily.   conjugated estrogens vaginal cream Commonly known as:  PREMARIN Place 1 Applicatorful vaginally daily. Apply 0.5mg  (pea-sized amount)  just inside the vaginal introitus with a finger-tip every night for two weeks and then Monday, Wednesday and Friday nights.   fluticasone 50 MCG/ACT nasal spray Commonly known as:  FLONASE Place 2 sprays into the nose daily.   levothyroxine 75 MCG tablet Commonly known as:  SYNTHROID, LEVOTHROID   multivitamin tablet Take 1 tablet by mouth daily.       Allergies:  Allergies  Allergen Reactions  . Bactrim [Sulfamethoxazole-Trimethoprim] Nausea And Vomiting  . Pollen Extract Other (See Comments)    respiratory  . Sulfa Antibiotics Rash    Other reaction(s): Other (See Comments) respiratory And GI Upset  . Sulfacetamide Sodium Rash    And GI Upset  . Sulfamethoxazole-Trimethoprim Rash    Family History: Family History  Problem Relation Age of Onset  . Cancer Father     colon  . Kidney cancer Neg Hx   . Renal cancer Neg Hx     Social History:  reports that she has never smoked. She has never used smokeless tobacco. She reports that she does not drink alcohol or use drugs.  ROS: UROLOGY Frequent Urination?: No Hard to  postpone urination?: Yes Burning/pain with urination?: No Get up at night to urinate?: Yes Leakage of urine?: Yes Urine stream starts and stops?: No Trouble starting stream?: No Do you have to strain to urinate?: No Blood in urine?: No Urinary tract infection?: No Sexually transmitted disease?: No Injury to kidneys or bladder?: No Painful intercourse?: No Weak stream?: No Currently pregnant?: No Vaginal bleeding?: No Last menstrual period?: n  Gastrointestinal Nausea?: No Vomiting?: No Indigestion/heartburn?: No Diarrhea?: No Constipation?: No  Constitutional Fever: No Night sweats?: No Weight loss?: No Fatigue?: No  Skin Skin rash/lesions?: No Itching?: No  Eyes Blurred vision?: No Double vision?: No  Ears/Nose/Throat Sore throat?: No Sinus problems?: Yes  Hematologic/Lymphatic Swollen glands?: No Easy bruising?: No  Cardiovascular Leg swelling?: No Chest pain?: No  Respiratory Cough?: No Shortness of breath?: No  Endocrine Excessive thirst?: No  Musculoskeletal Back pain?: No Joint pain?: No  Neurological Headaches?: No Dizziness?: No  Psychologic Depression?: No Anxiety?: No  Physical Exam: BP 124/74   Pulse 89   Ht 5\' 2"  (1.575 m)   Wt 169 lb (76.7 kg)   BMI 30.91 kg/m   Constitutional:  Alert and oriented, No acute distress. HEENT: [place] AT, moist mucus membranes.  Trachea midline, no masses. Cardiovascular: No clubbing, cyanosis, or edema. Respiratory: Normal respiratory effort, no increased work of breathing. GI: Abdomen is soft, nontender, nondistended, no abdominal masses GU: No CVA tenderness. Moderate grade 3 cystocele with central defect. Her vaginal cuff descended from 8 or 9 cm to approximately 4 or 5 cm. She had mild hyper mobility of the bladder neck and no stress incontinence with the cystocele reduced. She had no rectocele Skin: No rashes, bruises or suspicious lesions. Lymph: No cervical or inguinal  adenopathy. Neurologic: Grossly intact, no focal deficits, moving all 4 extremities. Psychiatric: Normal mood and affect.  Laboratory Data: Lab Results  Component Value Date   WBC 7.9 06/30/2014   HGB 10.5 (L) 07/01/2014   HCT 33.5 (L) 06/30/2014   MCV 89 06/30/2014   PLT 174 06/30/2014    Lab Results  Component Value Date   CREATININE 0.57 (L) 06/30/2014    No results found for: PSA  No results found for: TESTOSTERONE  No results found for: HGBA1C  Urinalysis    Component Value Date/Time   COLORURINE Yellow 06/28/2014 1216   APPEARANCEUR Clear 08/18/2015 1109   LABSPEC 1.004 06/28/2014 1216   PHURINE 7.0 06/28/2014 1216   GLUCOSEU Negative 08/18/2015 1109   GLUCOSEU Negative 06/28/2014 1216   HGBUR Negative 06/28/2014 1216   BILIRUBINUR Negative 08/18/2015 1109   BILIRUBINUR Negative 06/28/2014 1216   KETONESUR Negative 06/28/2014 1216   PROTEINUR Negative 08/18/2015 1109   PROTEINUR Negative 06/28/2014 1216   NITRITE Negative 08/18/2015 1109  NITRITE Positive 06/28/2014 1216   LEUKOCYTESUR Trace (A) 08/18/2015 1109   LEUKOCYTESUR 3+ 06/28/2014 1216    Pertinent Imaging: none  Assessment & Plan:  Patient has mixed incontinence that is mild but primarily urgency incontinence. She has hourly frequency and mild nocturia. She has mild enuresis. She has symptomatic prolapse.  She is most bothered by the frequent bladder. I drew her a picture. If she was ever considering surgery I would order urodynamics. If she ever had surgery she would likely best benefit from a transvaginal vault suspension with cystocele repair and graft  I will assess the patient in 6 weeks on Vesicare samples and prescription. I had to make clear to her that a prolapse repair would not likely help her frequency and urgency. We discussed things and in the future she may or may not wish to have surgery and realizes if so we would order urodynamics  1. Mixed incontinence 2. Cystocele 3.  Urinary frequency    There are no diagnoses linked to this encounter.  No Follow-up on file.  Reece Packer, MD  [facility] [address] [place], [place] [address] [phone]

## 2016-06-10 ENCOUNTER — Telehealth: Payer: Self-pay

## 2016-06-10 ENCOUNTER — Telehealth: Payer: Self-pay | Admitting: Urology

## 2016-06-10 ENCOUNTER — Ambulatory Visit (INDEPENDENT_AMBULATORY_CARE_PROVIDER_SITE_OTHER): Payer: Medicare Other

## 2016-06-10 VITALS — BP 121/77 | HR 74 | Ht 62.0 in | Wt 170.9 lb

## 2016-06-10 DIAGNOSIS — R3915 Urgency of urination: Secondary | ICD-10-CM | POA: Diagnosis not present

## 2016-06-10 LAB — URINALYSIS, COMPLETE
BILIRUBIN UA: NEGATIVE
GLUCOSE, UA: NEGATIVE
Ketones, UA: NEGATIVE
NITRITE UA: POSITIVE — AB
Protein, UA: NEGATIVE
Specific Gravity, UA: 1.01 (ref 1.005–1.030)
UUROB: 0.2 mg/dL (ref 0.2–1.0)
pH, UA: 6 (ref 5.0–7.5)

## 2016-06-10 LAB — MICROSCOPIC EXAMINATION: Epithelial Cells (non renal): >10 /hpf — AB (ref 0–10)

## 2016-06-10 LAB — BLADDER SCAN AMB NON-IMAGING: SCAN RESULT: 245

## 2016-06-10 NOTE — Addendum Note (Signed)
Addended by: Toniann Fail C on: 06/10/2016 02:21 PM   Modules accepted: Orders

## 2016-06-10 NOTE — Progress Notes (Signed)
Pt presented today with c/o urinary frequency and urgency, hard to postpone urination, leakage of urine, slight back pain and foul smelling urine. A clean catch was obtained for a u/a and cx. PVR was 245.   Blood pressure 121/77, pulse 74, height 5\' 2"  (1.575 m), weight 170 lb 14.4 oz (77.5 kg).

## 2016-06-10 NOTE — Telephone Encounter (Signed)
Spoke with pt in reference to possible UTI. Pt only symptom was strong urine smell. Pt denied n/v, f/c, dysuria. Pt requested to come in for urine check. Pt was added to nurse schedule for u/a and pvr.

## 2016-06-10 NOTE — Telephone Encounter (Signed)
Pt called stating that the new Rx that Dr. Matilde Sprang started her on earlier this week, is causing her to have an UTI.  Urine has strong odor and having low back pain/heaviness. Discomfort. Pt voices concerns about this and would like to speak to nurse or Dr. Abbott Pao stated she did not take today's dose.  Will be at number until 11:30 and after 1:30. Please advise.

## 2016-06-13 ENCOUNTER — Telehealth: Payer: Self-pay

## 2016-06-13 DIAGNOSIS — N390 Urinary tract infection, site not specified: Secondary | ICD-10-CM

## 2016-06-13 LAB — CULTURE, URINE COMPREHENSIVE

## 2016-06-13 MED ORDER — NITROFURANTOIN MACROCRYSTAL 100 MG PO CAPS: 100.0000 mg | ORAL_CAPSULE | Freq: Four times a day (QID) | ORAL | 0 refills | Status: DC

## 2016-06-13 NOTE — Telephone Encounter (Signed)
Per Dr. Noah Charon was sent to pt pharmacy. Spoke with pt and made aware. Pt voiced understanding.

## 2016-07-01 NOTE — Telephone Encounter (Signed)
Open in error

## 2016-07-11 ENCOUNTER — Ambulatory Visit (INDEPENDENT_AMBULATORY_CARE_PROVIDER_SITE_OTHER): Payer: Medicare Other | Admitting: Urology

## 2016-07-11 ENCOUNTER — Encounter: Payer: Self-pay | Admitting: Urology

## 2016-07-11 VITALS — BP 128/77 | HR 91 | Ht 62.0 in | Wt 168.4 lb

## 2016-07-11 DIAGNOSIS — N390 Urinary tract infection, site not specified: Secondary | ICD-10-CM

## 2016-07-11 DIAGNOSIS — N952 Postmenopausal atrophic vaginitis: Secondary | ICD-10-CM | POA: Diagnosis not present

## 2016-07-11 DIAGNOSIS — N8111 Cystocele, midline: Secondary | ICD-10-CM

## 2016-07-11 DIAGNOSIS — R339 Retention of urine, unspecified: Secondary | ICD-10-CM | POA: Diagnosis not present

## 2016-07-11 LAB — URINALYSIS, COMPLETE
BILIRUBIN UA: NEGATIVE
GLUCOSE, UA: NEGATIVE
KETONES UA: NEGATIVE
NITRITE UA: POSITIVE — AB
Protein, UA: NEGATIVE
Urobilinogen, Ur: 0.2 mg/dL (ref 0.2–1.0)
pH, UA: 6 (ref 5.0–7.5)

## 2016-07-11 LAB — MICROSCOPIC EXAMINATION: EPITHELIAL CELLS (NON RENAL): NONE SEEN /HPF (ref 0–10)

## 2016-07-11 NOTE — Progress Notes (Signed)
In and Out Catheterization  Patient is present today for a I & O catheterization due to patient thinks she has an uti. Patient was cleaned and prepped in a sterile fashion with betadine and Lidocaine 2% jelly was instilled into the urethra.  A 14FR cath was inserted no complications were noted , 349ml of urine return was noted, urine was yellow in color. A clean urine sample was collected for urinalysis. Bladder was drained and catheter was removed with out difficulty.    Preformed by: Lyndee Hensen CMA

## 2016-07-11 NOTE — Progress Notes (Signed)
07/11/2016 2:45 PM   Cassidy Bell [birth year] [id number]  Referring provider: Merrilee Seashore, MD [address] [place], [place] [address]  Chief Complaint  Patient presents with  . Urinary Tract Infection    patient thinks she has an uti    HPI: Patient is a 75 -year-old Caucasian female who presents today requesting an urgent appointment for a possible UTI.    Her symptoms are a strong odor and fatigue.  This is started four days ago.  She had a similar episode one month ago.  She was found to have a positive urine for E. Coli and was given nitrofurantoin.  Her symptoms cleared, but then the symptoms returned.  She then contacted her PCP and they gave her 5 days of Cipro.    She is having frequency, urgency, nocturia and incontinence.  These are baseline symptoms.    She denies dysuria, gross hematuria, suprapubic pain, back pain, abdominal pain or flank pain.   She has not had any recent fevers, chills, nausea or vomiting.  She does not have a history of nephrolithiasis, GU surgery or GU trauma.   Reviewing her records,  she has had two documented UTI's for E.coli.      She is not sexually active.  She has/has not noted a correlation with her urinary tract infections and sexual intercourse.    She is post menopausal.   She admits to constipation.  She does engage in good perineal hygiene. She does not take tub baths.   She has have incontinence.  She is using incontinence pads.  She is not having pain with bladder filling.    PMH: Past Medical History:  Diagnosis Date  . Arthritis   . Arthritis   . Atrophic vaginitis   . Bladder prolapse, female, acquired   . Hypertension   . Neuromuscular disorder ([place])   . Urinary retention     Surgical History: Past Surgical History:  Procedure Laterality Date  . ABDOMINAL HYSTERECTOMY    . bladder tach  1993  . CARPAL TUNNEL RELEASE    . COLONOSCOPY    . MUSCLE BIOPSY    . REPLACEMENT TOTAL HIP  W/  RESURFACING IMPLANTS  1996   Right & Left     Home Medications:  Allergies as of 07/11/2016      Reactions   Bactrim [sulfamethoxazole-trimethoprim] Nausea And Vomiting   Pollen Extract Other (See Comments)   respiratory   Sulfa Antibiotics Rash   Other reaction(s): Other (See Comments) respiratory And GI Upset   Sulfacetamide Sodium Rash   And GI Upset   Sulfamethoxazole-trimethoprim Rash      Medication List       Accurate as of 07/11/16  2:45 PM. Always use your most recent med list.          acetaminophen 500 MG tablet Commonly known as:  TYLENOL Take 1,000 mg by mouth.   aspirin 81 MG tablet Take 81 mg by mouth daily.   benazepril 10 MG tablet Commonly known as:  LOTENSIN Take 10 mg by mouth daily.   CALCIUM 600 + D PO Take by mouth. Reported on 08/25/2015   cetirizine 10 MG tablet Commonly known as:  ZYRTEC Take 10 mg by mouth daily.   conjugated estrogens vaginal cream Commonly known as:  PREMARIN Place 1 Applicatorful vaginally daily. Apply 0.5mg  (pea-sized amount)  just inside the vaginal introitus with a finger-tip every night for two weeks and then Monday, Wednesday and Friday nights.  fluticasone 50 MCG/ACT nasal spray Commonly known as:  FLONASE Place 2 sprays into the nose daily.   levothyroxine 75 MCG tablet Commonly known as:  SYNTHROID, LEVOTHROID   multivitamin tablet Take 1 tablet by mouth daily.   nitrofurantoin 100 MG capsule Commonly known as:  MACRODANTIN Take 1 capsule (100 mg total) by mouth 4 (four) times daily.   solifenacin 5 MG tablet Commonly known as:  VESICARE Take 1 tablet (5 mg total) by mouth daily.       Allergies:  Allergies  Allergen Reactions  . Bactrim [Sulfamethoxazole-Trimethoprim] Nausea And Vomiting  . Pollen Extract Other (See Comments)    respiratory  . Sulfa Antibiotics Rash    Other reaction(s): Other (See Comments) respiratory And GI Upset  . Sulfacetamide Sodium Rash    And GI Upset    . Sulfamethoxazole-Trimethoprim Rash    Family History: Family History  Problem Relation Age of Onset  . Cancer Father     colon  . Kidney cancer Neg Hx   . Renal cancer Neg Hx   . Bladder Cancer Neg Hx   . Kidney disease Neg Hx     Social History:  reports that she has never smoked. She has never used smokeless tobacco. She reports that she does not drink alcohol or use drugs.  ROS: UROLOGY Frequent Urination?: Yes Hard to postpone urination?: Yes Burning/pain with urination?: No Get up at night to urinate?: Yes Leakage of urine?: Yes Urine stream starts and stops?: No Trouble starting stream?: No Do you have to strain to urinate?: No Blood in urine?: No Urinary tract infection?: No Sexually transmitted disease?: No Injury to kidneys or bladder?: No Painful intercourse?: No Weak stream?: No Currently pregnant?: No Vaginal bleeding?: No Last menstrual period?: n  Gastrointestinal Nausea?: No Vomiting?: No Indigestion/heartburn?: No Diarrhea?: No Constipation?: No  Constitutional Fever: No Night sweats?: No Weight loss?: No Fatigue?: No  Skin Skin rash/lesions?: No Itching?: No  Eyes Blurred vision?: No Double vision?: No  Ears/Nose/Throat Sore throat?: No Sinus problems?: No  Hematologic/Lymphatic Swollen glands?: No Easy bruising?: No  Cardiovascular Leg swelling?: No Chest pain?: No  Respiratory Cough?: No Shortness of breath?: No  Endocrine Excessive thirst?: No  Musculoskeletal Back pain?: No Joint pain?: No  Neurological Headaches?: No Dizziness?: No  Psychologic Depression?: No Anxiety?: No  Physical Exam: BP 128/77   Pulse 91   Ht 5\' 2"  (1.575 m)   Wt 168 lb 6.4 oz (76.4 kg)   BMI 30.80 kg/m   Constitutional: Well nourished. Alert and oriented, No acute distress. HEENT: [place] AT, moist mucus membranes. Trachea midline, no masses. Cardiovascular: No clubbing, cyanosis, or edema. Respiratory: Normal respiratory  effort, no increased work of breathing. GI: Abdomen is soft, non tender, non distended, no abdominal masses. Liver and spleen not palpable.  No hernias appreciated.  Stool sample for occult testing is not indicated.   GU: No CVA tenderness.  No bladder fullness or masses.   Skin: No rashes, bruises or suspicious lesions. Lymph: No cervical or inguinal adenopathy. Neurologic: Grossly intact, no focal deficits, moving all 4 extremities. Psychiatric: Normal mood and affect.  Laboratory Data: Lab Results  Component Value Date   WBC 7.9 06/30/2014   HGB 10.5 (L) 07/01/2014   HCT 33.5 (L) 06/30/2014   MCV 89 06/30/2014   PLT 174 06/30/2014    Lab Results  Component Value Date   CREATININE 0.57 (L) 06/30/2014    Lab Results  Component Value Date   AST  40 (H) 06/28/2014   Lab Results  Component Value Date   ALT 43 06/28/2014     Urinalysis 6-10 WBC's, many bacteria and nitrite positive.   Assessment & Plan:    1. Recurrent UTI's  - Patient is instructed to increase her water intake until the urine is pale yellow or clear.  I have advised her to take probiotics (yogurt, oral pills or vaginal suppositories), take cranberry pills or drink the juice and use the estrogen cream.  She is to take Vitamin C 1,000 mg daily to acidify the urine.   She should also avoid soaking in tubs and wipe front to back after urinating.  She may benefit from core strengthening exercises.  We can refer her to PT if she desires.    - Because of her history of recurrent UTIs, I have asked the patient to contact our office if she should experience symptoms of urinary tract infection so that we can CATH her for an urine specimen for urinalysis and culture. This is to prevent a skin contaminant from showing up in the urine culture.  If she should have her symptoms after hours or cannot get to our office, she should notify her other providers that she needs a catheterized specimen for UA and culture.   - I  reviewed the symptoms of a urinary tract infection, such as a worsening of urinary urgency and frequency, dysuria, which is painful urination and not the pain of urine hitting sensitive perineal skin, hematuria, foul-smelling urine, suprapubic pain or mental status changes. Fevers, chills, nausea and or vomiting can also be signs of a possible UTI.  Positive urinalyses and positive urine cultures that are not associated with urinary symptoms should not be treated with antibiotics.    - I explained to the patient that being exposed to unnecessary antibiotics can put her at risk for increasing resistance of the bacteria to antibiotics, C. difficile and the side effects of the antibiotics.    - UA is suspicious for infection  - Urine sent for culture- hold on prescribing an antibiotic until sensitivities are available.  2. Urinary retention  - Her PVR was 300 mL today   3. Atrophic vaginitis  - encouraged the patient to continue the vaginal cream three nights weekly  4. Cystocele  - failed pessary  - has upcoming appointment with Dr. Matilde Sprang for further discussion                                    Return for pending urine culture.  These notes generated with voice recognition software. I apologize for typographical errors.  [name], [facility] [address] [place], [place] [address] [phone]

## 2016-07-11 NOTE — Patient Instructions (Signed)

## 2016-07-14 ENCOUNTER — Telehealth: Payer: Self-pay

## 2016-07-14 NOTE — Telephone Encounter (Signed)
Pt called demanding abx. Made pt aware we dont have the official results. Pt demanded something now due to having severe back pain. Please advise.

## 2016-07-15 ENCOUNTER — Other Ambulatory Visit: Payer: Self-pay | Admitting: Urology

## 2016-07-15 ENCOUNTER — Telehealth: Payer: Self-pay | Admitting: Urology

## 2016-07-15 LAB — CULTURE, URINE COMPREHENSIVE

## 2016-07-15 MED ORDER — AMOXICILLIN-POT CLAVULANATE 875-125 MG PO TABS: 1.0000 | ORAL_TABLET | Freq: Two times a day (BID) | ORAL | 0 refills | Status: DC

## 2016-07-15 NOTE — Telephone Encounter (Signed)
Please advise the patient that her urine culture is not back yet this afternoon.  I will continue to watch for the results.  If the results aren't available until after 5 pm, she should contact [facility] in [place] periodically over the weekend as I will be sending the antibiotic there once sensitivities are available.  If she signs up for MyChart, I can communicate with her through email.

## 2016-07-15 NOTE — Telephone Encounter (Signed)
No answer

## 2016-07-15 NOTE — Telephone Encounter (Signed)
Spoke with pt in reference to ucx results not being back. Pt came very ugly expressing how disgusted she was with the whole situation. Pt demanded to have an abx now as she feels the UTI will affect her hip replacements. Reinforced with pt [name] will not be giving any abx until ucx results are back due to having been on 2 abx previously. Pt once again started getting very ugly. Reinforced with pt once again reason for no abx yet and that [name] will be looking for the ucx results and to keep checking pharmacy. Also made pt aware can sign up for mychart to hear form [facility] directly. Pt stated that we were causing her a to be inconvienced and she did not appreciate it. Pt then hung the phone up.

## 2016-07-18 NOTE — Telephone Encounter (Signed)
Patient has a +UCx. They need to start Augmentin 875/125, one tablet twice daily for ten days. They also need to take a probiotic with the antibiotic course. The dosage is listed below:  L. acidophilus and L. casei (25 x 109 CFU/day for 2 days, then 50 x 109 CFU/day for duration of the antibiotic course)  I have e scribed their prescription to [facility] in [place], [place] with patient and notified her of the results. She states she picked up script from pharm and has started the probiotic as well.

## 2016-07-18 NOTE — Progress Notes (Signed)
Previously addressed, see phone note

## 2016-08-08 ENCOUNTER — Encounter: Payer: Self-pay | Admitting: Urology

## 2016-08-08 ENCOUNTER — Ambulatory Visit (INDEPENDENT_AMBULATORY_CARE_PROVIDER_SITE_OTHER): Payer: Medicare Other | Admitting: Urology

## 2016-08-08 VITALS — BP 123/75 | HR 84 | Ht 62.0 in | Wt 169.0 lb

## 2016-08-08 DIAGNOSIS — N3946 Mixed incontinence: Secondary | ICD-10-CM | POA: Diagnosis not present

## 2016-08-08 DIAGNOSIS — N390 Urinary tract infection, site not specified: Secondary | ICD-10-CM | POA: Diagnosis not present

## 2016-08-08 MED ORDER — TRIMETHOPRIM 100 MG PO TABS: 100.0000 mg | ORAL_TABLET | Freq: Every day | ORAL | 11 refills | Status: DC

## 2016-08-08 NOTE — Progress Notes (Signed)
08/08/2016 11:18 AM   Cassidy Bell 1941-01-10 [id number]  Referring provider: Merrilee Seashore, MD [address] [place], [place] [address]  Chief Complaint  Patient presents with  . Urinary Incontinence    6wk     HPI: Cassidy Bell May 2017 Patient was found to have overflow incontinence and a PVR of over 600 cc who had a foley placed for bladder decompression for one week. She was also advised to discontinue the oxybutynin. She is voiding successfully on her own. Her PVR today is 328 mL. Her former PVR was 158 mL at her last visit. She is currently working with gynecology on a pessary fitting for her cystocele. She states she is actually surprised as her incontinence is much less. She is still experiencing incontinence but not as often or as large of volume as in the past.   Today The patient has urgency incontinence. She sometimes leaks with coughing and sneezing but not bending and lifting. She has mild enuresis. She voids every 1 hour and gets up once at night. She wears 1-2 pads per day. The volume loss is larger if she holds it too long.  She can feel and see prolapse and sometimes reduces it. She does no splinting maneuvers. She has had a previous bladder suspension. He no longer has a pessary  Infrequently she gets a urinary tract infection with typical cystitis symptoms.  Moderate grade 3 cystocele with central defect. Her vaginal cuff descended from 8 or 9 cm to approximately 4 or 5 cm. She had mild hyper mobility of the bladder neck and no stress incontinence with the cystocele reduced. She had no rectocele  Patient has mixed incontinence that is mild but primarily urgency incontinence. She has hourly frequency and mild nocturia. She has mild enuresis. She has symptomatic prolapse.  She is most bothered by the frequent bladder. I drew her a picture. If she was ever considering surgery I would order urodynamics. If she ever had surgery she would  likely best benefit from a transvaginal vault suspension with cystocele repair and graft  I will assess the patient in 6 weeks on Vesicare samples and prescription. I had to make clear to her that a prolapse repair would not likely help her frequency and urgency. We discussed things and in the future she may or may not wish to have surgery and realizes if so we would order urodynamics  Today The patient felt that the Vesicare was affecting her ability to empty. She said the same thing happen with oxybutynin. As noted prior she's had a very high volume retention issue in the past.  She believes she said 5 bladder infections in the last year with small-volume frequency and foul-smelling urine and just was treated with 1 with a good response    PMH: Past Medical History:  Diagnosis Date  . Arthritis   . Arthritis   . Atrophic vaginitis   . Bladder prolapse, female, acquired   . Hypertension   . Neuromuscular disorder ([place])   . Urinary retention     Surgical History: Past Surgical History:  Procedure Laterality Date  . ABDOMINAL HYSTERECTOMY    . bladder tach  1993  . CARPAL TUNNEL RELEASE    . COLONOSCOPY    . MUSCLE BIOPSY    . REPLACEMENT TOTAL HIP W/  RESURFACING IMPLANTS  1996   Right & Left     Home Medications:  Allergies as of 08/08/2016      Reactions   Bactrim [  sulfamethoxazole-trimethoprim] Nausea And Vomiting   Pollen Extract Other (See Comments)   respiratory   Sulfa Antibiotics Rash   Other reaction(s): Other (See Comments) respiratory And GI Upset   Sulfacetamide Sodium Rash   And GI Upset   Sulfamethoxazole-trimethoprim Rash      Medication List       Accurate as of 08/08/16 11:18 AM. Always use your most recent med list.          acetaminophen 500 MG tablet Commonly known as:  TYLENOL Take 1,000 mg by mouth.   aspirin 81 MG tablet Take 81 mg by mouth daily.   benazepril 10 MG tablet Commonly known as:  LOTENSIN Take 10 mg by mouth  daily.   CALCIUM 600 + D PO Take by mouth. Reported on 08/25/2015   cetirizine 10 MG tablet Commonly known as:  ZYRTEC Take 10 mg by mouth daily.   conjugated estrogens vaginal cream Commonly known as:  PREMARIN Place 1 Applicatorful vaginally daily. Apply 0.5mg  (pea-sized amount)  just inside the vaginal introitus with a finger-tip every night for two weeks and then Monday, Wednesday and Friday nights.   fluticasone 50 MCG/ACT nasal spray Commonly known as:  FLONASE Place 2 sprays into the nose daily.   levothyroxine 75 MCG tablet Commonly known as:  SYNTHROID, LEVOTHROID   multivitamin tablet Take 1 tablet by mouth daily.   trimethoprim 100 MG tablet Commonly known as:  TRIMPEX Take 1 tablet (100 mg total) by mouth daily.       Allergies:  Allergies  Allergen Reactions  . Bactrim [Sulfamethoxazole-Trimethoprim] Nausea And Vomiting  . Pollen Extract Other (See Comments)    respiratory  . Sulfa Antibiotics Rash    Other reaction(s): Other (See Comments) respiratory And GI Upset  . Sulfacetamide Sodium Rash    And GI Upset  . Sulfamethoxazole-Trimethoprim Rash    Family History: Family History  Problem Relation Age of Onset  . Cancer Father     colon  . Kidney cancer Neg Hx   . Renal cancer Neg Hx   . Bladder Cancer Neg Hx   . Kidney disease Neg Hx     Social History:  reports that she has never smoked. She has never used smokeless tobacco. She reports that she does not drink alcohol or use drugs.  ROS: UROLOGY Frequent Urination?: Yes Hard to postpone urination?: No Burning/pain with urination?: No Get up at night to urinate?: Yes Leakage of urine?: Yes Urine stream starts and stops?: No Trouble starting stream?: No Do you have to strain to urinate?: No Blood in urine?: No Urinary tract infection?: No Sexually transmitted disease?: No Injury to kidneys or bladder?: No Painful intercourse?: No Weak stream?: No Currently pregnant?: No Vaginal  bleeding?: No Last menstrual period?: n  Gastrointestinal Nausea?: No Vomiting?: No Indigestion/heartburn?: No Diarrhea?: No Constipation?: No  Constitutional Fever: No Night sweats?: No Weight loss?: No Fatigue?: No  Skin Skin rash/lesions?: No Itching?: No  Eyes Blurred vision?: No Double vision?: No  Ears/Nose/Throat Sore throat?: No Sinus problems?: No  Hematologic/Lymphatic Swollen glands?: No Easy bruising?: No  Cardiovascular Leg swelling?: No Chest pain?: No  Respiratory Cough?: No Shortness of breath?: No  Endocrine Excessive thirst?: No  Musculoskeletal Back pain?: No Joint pain?: No  Neurological Headaches?: No Dizziness?: No  Psychologic Depression?: No Anxiety?: No  Physical Exam: BP 123/75   Pulse 84   Ht 5\' 2"  (1.575 m)   Wt 169 lb (76.7 kg)   BMI 30.91 kg/m  Laboratory Data: Lab Results  Component Value Date   WBC 7.9 06/30/2014   HGB 10.5 (L) 07/01/2014   HCT 33.5 (L) 06/30/2014   MCV 89 06/30/2014   PLT 174 06/30/2014    Lab Results  Component Value Date   CREATININE 0.57 (L) 06/30/2014    No results found for: PSA  No results found for: TESTOSTERONE  No results found for: HGBA1C  Urinalysis    Component Value Date/Time   COLORURINE Yellow 06/28/2014 1216   APPEARANCEUR Cloudy (A) 07/11/2016 1409   LABSPEC 1.004 06/28/2014 1216   PHURINE 7.0 06/28/2014 1216   GLUCOSEU Negative 07/11/2016 1409   GLUCOSEU Negative 06/28/2014 1216   HGBUR Negative 06/28/2014 1216   BILIRUBINUR Negative 07/11/2016 1409   BILIRUBINUR Negative 06/28/2014 1216   KETONESUR Negative 06/28/2014 1216   PROTEINUR Negative 07/11/2016 1409   PROTEINUR Negative 06/28/2014 1216   NITRITE Positive (A) 07/11/2016 1409   NITRITE Positive 06/28/2014 1216   LEUKOCYTESUR 1+ (A) 07/11/2016 1409   LEUKOCYTESUR 3+ 06/28/2014 1216    Pertinent Imaging: none  Assessment & Plan:  The patient and I spoke about goals again. She has  never committed herself to having her prolapse treated. She understands it is not the cause of her urinary tract infections or voiding dysfunction. It could aggravate her retention.  Pathophysiology of chronic cystitis discussed. Today I started her on trimethoprim 100 mg with 30 tablets of 11 refills. Sulfa allergy discussed. I will see her in 6 weeks. We will get a baseline renal ultrasound. I'm hoping this helps down regulate some of her frequency which again is a dominating symptom. Fluid modifications was also discussed and she does drink a lot of fluid    There are no diagnoses linked to this encounter.  No Follow-up on file.  Reece Packer, MD  [facility] [address] [place], [place] [address] [phone]

## 2016-08-30 ENCOUNTER — Other Ambulatory Visit: Payer: Self-pay | Admitting: Internal Medicine

## 2016-08-30 DIAGNOSIS — Z1231 Encounter for screening mammogram for malignant neoplasm of breast: Secondary | ICD-10-CM

## 2016-09-06 ENCOUNTER — Ambulatory Visit
Admission: RE | Admit: 2016-09-06 | Discharge: 2016-09-06 | Disposition: A | Discharge status: Home / Self Care | Payer: Medicare Other | Source: Ambulatory Visit | Attending: Urology | Admitting: Urology

## 2016-09-06 DIAGNOSIS — N390 Urinary tract infection, site not specified: Secondary | ICD-10-CM | POA: Diagnosis not present

## 2016-09-13 ENCOUNTER — Ambulatory Visit (INDEPENDENT_AMBULATORY_CARE_PROVIDER_SITE_OTHER): Payer: Medicare Other | Admitting: Urology

## 2016-09-13 VITALS — BP 108/72 | HR 71 | Ht 62.0 in | Wt 168.0 lb

## 2016-09-13 DIAGNOSIS — R350 Frequency of micturition: Secondary | ICD-10-CM

## 2016-09-13 NOTE — Progress Notes (Signed)
09/13/2016 11:30 AM   Cassidy Bell 12-May-1941 [id number]  Referring provider: Merrilee Seashore, MD [address] [place], [place] [address]  Chief Complaint  Patient presents with  . New Patient (Initial Visit)    recurrent UTI, urge incontinence    HPI: The patient has urgency incontinence. She sometimes leaks with coughing and sneezing but not bending and lifting. She has mild enuresis. She voids every 1 hour and gets up once at night. She wears 1-2 pads per day. The volume loss is larger if she holds it too long.  She can feel and see prolapse and sometimes reduces it. She does no splinting maneuvers. She has had a previous bladder suspension. He no longer has a pessary  Infrequently she gets a urinary tract infection with typical cystitis symptoms.  Moderate grade 3 cystocele with central defect. Her vaginal cuff descended from 8 or 9 cm to approximately 4 or 5 cm. She had mild hyper mobility of the bladder neck and no stress incontinence with the cystocele reduced. She had no rectocele  Patient has mixed incontinence that is mild but primarily urgency incontinence. She has hourly frequency and mild nocturia. She has mild enuresis. She has symptomatic prolapse.  She is most bothered by the frequent bladder. I drew her a picture. If she was ever considering surgery I would order urodynamics. If she ever had surgery she would likely best benefit from a transvaginal vault suspension with cystocele repair and graft  During the last visit we noted treatment goals in regard to prolapse surgery. She had trouble to urinate on his Vesicare and oxybutynin. She had had 5 bladder infections and I placed her on trimethoprim.  Today  Her renal ultrasound was normal Clinically the patient is infection free and please. Her urgency may be a little bit better but not her frequency. She still believes that her residuals are approximate 200 mL. Frequency is otherwise  stable   Past Medical History:  Diagnosis Date  . Arthritis   . Arthritis   . Atrophic vaginitis   . Bladder prolapse, female, acquired   . Hypertension   . Neuromuscular disorder ([place])   . Urinary retention     Surgical History: Past Surgical History:  Procedure Laterality Date  . ABDOMINAL HYSTERECTOMY    . bladder tach  1993  . CARPAL TUNNEL RELEASE    . COLONOSCOPY    . MUSCLE BIOPSY    . REPLACEMENT TOTAL HIP W/  RESURFACING IMPLANTS  1996   Right & Left     Home Medications:  Allergies as of 09/13/2016      Reactions   Bactrim [sulfamethoxazole-trimethoprim] Nausea And Vomiting   Pollen Extract Other (See Comments)   respiratory   Sulfa Antibiotics Rash   Other reaction(s): Other (See Comments) respiratory And GI Upset   Sulfacetamide Sodium Rash   And GI Upset   Sulfamethoxazole-trimethoprim Rash      Medication List       Accurate as of 09/13/16 11:30 AM. Always use your most recent med list.          acetaminophen 500 MG tablet Commonly known as:  TYLENOL Take 500 mg by mouth.   aspirin 81 MG tablet Take 81 mg by mouth daily.   benazepril 10 MG tablet Commonly known as:  LOTENSIN Take 10 mg by mouth daily.   CALCIUM 600 + D PO Take by mouth. Reported on 08/25/2015   cetirizine 10 MG tablet Commonly known as:  ZYRTEC  Take 10 mg by mouth daily.   conjugated estrogens vaginal cream Commonly known as:  PREMARIN Place 1 Applicatorful vaginally daily. Apply 0.5mg  (pea-sized amount)  just inside the vaginal introitus with a finger-tip every night for two weeks and then Monday, Wednesday and Friday nights.   fluticasone 50 MCG/ACT nasal spray Commonly known as:  FLONASE Place 2 sprays into the nose daily.   levothyroxine 75 MCG tablet Commonly known as:  SYNTHROID, LEVOTHROID   multivitamin tablet Take 1 tablet by mouth daily.   trimethoprim 100 MG tablet Commonly known as:  TRIMPEX Take 1 tablet (100 mg total) by mouth daily.     Vitamin D 2000 units Caps Take by mouth.       Allergies:  Allergies  Allergen Reactions  . Bactrim [Sulfamethoxazole-Trimethoprim] Nausea And Vomiting  . Pollen Extract Other (See Comments)    respiratory  . Sulfa Antibiotics Rash    Other reaction(s): Other (See Comments) respiratory And GI Upset  . Sulfacetamide Sodium Rash    And GI Upset  . Sulfamethoxazole-Trimethoprim Rash    Family History: Family History  Problem Relation Age of Onset  . Cancer Father     colon  . Kidney cancer Neg Hx   . Renal cancer Neg Hx   . Bladder Cancer Neg Hx   . Kidney disease Neg Hx     Social History:  reports that she has never smoked. She has never used smokeless tobacco. She reports that she does not drink alcohol or use drugs.  ROS: UROLOGY Frequent Urination?: Yes Hard to postpone urination?: No Burning/pain with urination?: No Get up at night to urinate?: No Leakage of urine?: Yes Urine stream starts and stops?: No Trouble starting stream?: No Do you have to strain to urinate?: No Blood in urine?: No Urinary tract infection?: No Sexually transmitted disease?: No Injury to kidneys or bladder?: No Painful intercourse?: No Weak stream?: No Currently pregnant?: No Vaginal bleeding?: No Last menstrual period?: n  Gastrointestinal Nausea?: No Vomiting?: No Indigestion/heartburn?: No Diarrhea?: No Constipation?: No  Constitutional Fever: No Night sweats?: No Weight loss?: No Fatigue?: No  Skin Skin rash/lesions?: No Itching?: No  Eyes Blurred vision?: No Double vision?: No  Ears/Nose/Throat Sore throat?: No Sinus problems?: No  Hematologic/Lymphatic Swollen glands?: No Easy bruising?: No  Cardiovascular Leg swelling?: No Chest pain?: No  Respiratory Cough?: No Shortness of breath?: No  Endocrine Excessive thirst?: No  Musculoskeletal Back pain?: No Joint pain?: No  Neurological Headaches?: No Dizziness?:  No  Psychologic Depression?: No Anxiety?: No  Physical Exam: BP 108/72   Pulse 71   Ht 5\' 2"  (1.575 m)   Wt 76.2 kg (168 lb)   BMI 30.73 kg/m   Constitutional:  Alert and oriented, No acute distress.   Laboratory Data: Lab Results  Component Value Date   WBC 7.9 06/30/2014   HGB 10.5 (L) 07/01/2014   HCT 33.5 (L) 06/30/2014   MCV 89 06/30/2014   PLT 174 06/30/2014    Lab Results  Component Value Date   CREATININE 0.57 (L) 06/30/2014    No results found for: PSA  No results found for: TESTOSTERONE  No results found for: HGBA1C  Urinalysis    Component Value Date/Time   COLORURINE Yellow 06/28/2014 1216   APPEARANCEUR Cloudy (A) 07/11/2016 1409   LABSPEC 1.004 06/28/2014 1216   PHURINE 7.0 06/28/2014 1216   GLUCOSEU Negative 07/11/2016 1409   GLUCOSEU Negative 06/28/2014 1216   HGBUR Negative 06/28/2014 1216   BILIRUBINUR  Negative 07/11/2016 1409   BILIRUBINUR Negative 06/28/2014 1216   KETONESUR Negative 06/28/2014 1216   PROTEINUR Negative 07/11/2016 1409   PROTEINUR Negative 06/28/2014 1216   NITRITE Positive (A) 07/11/2016 1409   NITRITE Positive 06/28/2014 1216   LEUKOCYTESUR 1+ (A) 07/11/2016 1409   LEUKOCYTESUR 3+ 06/28/2014 1216    Pertinent Imaging: See above  Assessment & Plan:  The patient still has frequency and mildly symptomatic prolapse. If we pursued surgery I will order urodynamics and this was mentioned again. We will check another residual next time and I gave her 5 weeks of the beta 3 agonist 25 mg. She understands a prolapse surgery in general will not help her overactive bladder. Having said that if it reduced to residual it could but this should not be her expectation consenting for surgery. I will see her back on the beta 3 agonist for a residual on daily antibiotic prophylaxis. We will proceed accordingly  There are no diagnoses linked to this encounter.  Return in about 1 month (around 10/11/2016) for  recurrent UTI w/ residual  w/ Bauer Ausborn.  Reece Packer, MD  [facility] [address] [place], [place] [address] [phone]

## 2016-10-10 ENCOUNTER — Encounter: Payer: Self-pay | Admitting: Urology

## 2016-10-10 ENCOUNTER — Ambulatory Visit: Payer: Medicare Other | Admitting: Urology

## 2016-10-17 ENCOUNTER — Other Ambulatory Visit: Payer: Self-pay | Admitting: Orthopedic Surgery

## 2016-10-17 ENCOUNTER — Other Ambulatory Visit (HOSPITAL_COMMUNITY): Payer: Self-pay | Admitting: Orthopedic Surgery

## 2016-10-17 DIAGNOSIS — M25561 Pain in right knee: Secondary | ICD-10-CM

## 2016-10-17 DIAGNOSIS — M25551 Pain in right hip: Secondary | ICD-10-CM | POA: Diagnosis not present

## 2016-10-17 DIAGNOSIS — Z96643 Presence of artificial hip joint, bilateral: Secondary | ICD-10-CM | POA: Diagnosis not present

## 2016-10-17 DIAGNOSIS — Z471 Aftercare following joint replacement surgery: Secondary | ICD-10-CM | POA: Diagnosis not present

## 2016-10-17 DIAGNOSIS — Z96641 Presence of right artificial hip joint: Secondary | ICD-10-CM | POA: Diagnosis not present

## 2016-10-28 ENCOUNTER — Ambulatory Visit
Admission: RE | Admit: 2016-10-28 | Discharge: 2016-10-28 | Disposition: A | Discharge status: Home / Self Care | Payer: Medicare Other | Source: Ambulatory Visit | Attending: Orthopedic Surgery | Admitting: Orthopedic Surgery

## 2016-10-28 DIAGNOSIS — M87851 Other osteonecrosis, right femur: Secondary | ICD-10-CM | POA: Diagnosis not present

## 2016-10-28 DIAGNOSIS — M62561 Muscle wasting and atrophy, not elsewhere classified, right lower leg: Secondary | ICD-10-CM | POA: Diagnosis not present

## 2016-10-28 DIAGNOSIS — M87861 Other osteonecrosis, right tibia: Secondary | ICD-10-CM | POA: Diagnosis not present

## 2016-10-28 DIAGNOSIS — M25561 Pain in right knee: Secondary | ICD-10-CM | POA: Diagnosis present

## 2016-11-01 DIAGNOSIS — E782 Mixed hyperlipidemia: Secondary | ICD-10-CM | POA: Diagnosis not present

## 2016-11-01 DIAGNOSIS — E6609 Other obesity due to excess calories: Secondary | ICD-10-CM | POA: Diagnosis not present

## 2016-11-01 DIAGNOSIS — E039 Hypothyroidism, unspecified: Secondary | ICD-10-CM | POA: Diagnosis not present

## 2016-11-01 DIAGNOSIS — Z0000 Encounter for general adult medical examination without abnormal findings: Secondary | ICD-10-CM | POA: Diagnosis not present

## 2016-11-01 DIAGNOSIS — I10 Essential (primary) hypertension: Secondary | ICD-10-CM | POA: Diagnosis not present

## 2016-11-01 DIAGNOSIS — Z780 Asymptomatic menopausal state: Secondary | ICD-10-CM | POA: Diagnosis not present

## 2016-11-01 DIAGNOSIS — M109 Gout, unspecified: Secondary | ICD-10-CM | POA: Diagnosis not present

## 2016-11-07 ENCOUNTER — Ambulatory Visit (INDEPENDENT_AMBULATORY_CARE_PROVIDER_SITE_OTHER): Payer: Medicare Other | Admitting: Urology

## 2016-11-07 ENCOUNTER — Encounter: Payer: Self-pay | Admitting: Urology

## 2016-11-07 VITALS — BP 111/72 | HR 76 | Ht 62.0 in | Wt 168.5 lb

## 2016-11-07 DIAGNOSIS — N390 Urinary tract infection, site not specified: Secondary | ICD-10-CM

## 2016-11-07 DIAGNOSIS — R339 Retention of urine, unspecified: Secondary | ICD-10-CM

## 2016-11-07 NOTE — Progress Notes (Signed)
11/07/2016 4:10 PM   [name] 09-26-1940 [id number]  Referring provider: Merrilee Seashore, MD [address] [place], [place] [address]  Chief Complaint  Patient presents with  . Follow-up    urinary frequency, recurrent UTI    HPI: The patient has urgency incontinence. She sometimes leaks with coughing and sneezing but not bending and lifting. She has mild enuresis. She voids every 1 hour and gets up once at night. She wears 1-2 pads per day. The volume loss is larger if she holds it too long.  She can feel and see prolapse and sometimes reduces it. She does no splinting maneuvers. She has had a previous bladder suspension. He no longer has a pessary  Infrequently she gets a urinary tract infection with typical cystitis symptoms.  Moderate grade 3 cystocele with central defect. Her vaginal cuff descended from 8 or 9 cm to approximately 4 or 5 cm. She had mild hyper mobility of the bladder neck and no stress incontinence with the cystocele reduced. She had no rectocele  Patient has mixed incontinence that is mild but primarily urgency incontinence. She has hourly frequency and mild nocturia. She has mild enuresis. She has symptomatic prolapse.  She is most bothered by the frequent bladder. I drew her a picture. If she was ever considering surgery I would order urodynamics. If she ever had surgery she would likely best benefit from a transvaginal vault suspension with cystocele repair and graft  During the last visit we noted treatment goals in regard to prolapse surgery. She had trouble to urinate on his Vesicare and oxybutynin. She had had 5 bladder infections and I placed her on trimethoprim. Her renal ultrasound was normal and she was clinically infection free and may have abated residuals of 200 mL. Last visit she was given the beta 3 agonist 25 mg. She understands a prolapse surgery will not help her bothersome frequency. Theoretically improvement of  residual urine volume could help her frequency.  Today Clinically noninfected on daily trimethoprim. The patient really thinks that her frequency is improving and never tried the beta 3 agonist possibly for this reason but also she was having a lot of knee issues.   PMH: Past Medical History:  Diagnosis Date  . Arthritis   . Arthritis   . Atrophic vaginitis   . Bladder prolapse, female, acquired   . Hypertension   . Neuromuscular disorder ([place])   . Urinary retention     Surgical History: Past Surgical History:  Procedure Laterality Date  . ABDOMINAL HYSTERECTOMY    . bladder tach  1993  . CARPAL TUNNEL RELEASE    . COLONOSCOPY    . MUSCLE BIOPSY    . REPLACEMENT TOTAL HIP W/  RESURFACING IMPLANTS  1996   Right & Left     Home Medications:  Allergies as of 11/07/2016      Reactions   Bactrim [sulfamethoxazole-trimethoprim] Nausea And Vomiting   Pollen Extract Other (See Comments)   respiratory   Sulfa Antibiotics Rash   Other reaction(s): Other (See Comments) respiratory And GI Upset   Sulfacetamide Sodium Rash   And GI Upset   Sulfamethoxazole-trimethoprim Rash      Medication List       Accurate as of 11/07/16  4:10 PM. Always use your most recent med list.          acetaminophen 500 MG tablet Commonly known as:  TYLENOL Take 500 mg by mouth.   aspirin 81 MG tablet Take 81  mg by mouth daily.   benazepril 10 MG tablet Commonly known as:  LOTENSIN Take 10 mg by mouth daily.   CALCIUM 600 + D PO Take by mouth. Reported on 08/25/2015   cetirizine 10 MG tablet Commonly known as:  ZYRTEC Take 10 mg by mouth daily.   conjugated estrogens vaginal cream Commonly known as:  PREMARIN Place 1 Applicatorful vaginally daily. Apply 0.5mg  (pea-sized amount)  just inside the vaginal introitus with a finger-tip every night for two weeks and then Monday, Wednesday and Friday nights.   fluticasone 50 MCG/ACT nasal spray Commonly known as:  FLONASE Place 2 sprays  into the nose daily.   levothyroxine 75 MCG tablet Commonly known as:  SYNTHROID, LEVOTHROID   multivitamin tablet Take 1 tablet by mouth daily.   trimethoprim 100 MG tablet Commonly known as:  TRIMPEX Take 1 tablet (100 mg total) by mouth daily.   Vitamin D 2000 units Caps Take by mouth.       Allergies:  Allergies  Allergen Reactions  . Bactrim [Sulfamethoxazole-Trimethoprim] Nausea And Vomiting  . Pollen Extract Other (See Comments)    respiratory  . Sulfa Antibiotics Rash    Other reaction(s): Other (See Comments) respiratory And GI Upset  . Sulfacetamide Sodium Rash    And GI Upset  . Sulfamethoxazole-Trimethoprim Rash    Family History: Family History  Problem Relation Age of Onset  . Cancer Father     colon  . Kidney cancer Neg Hx   . Renal cancer Neg Hx   . Bladder Cancer Neg Hx   . Kidney disease Neg Hx     Social History:  reports that she has never smoked. She has never used smokeless tobacco. She reports that she does not drink alcohol or use drugs.  ROS: UROLOGY Frequent Urination?: No Hard to postpone urination?: No Burning/pain with urination?: No Get up at night to urinate?: No Leakage of urine?: Yes Urine stream starts and stops?: No Trouble starting stream?: No Do you have to strain to urinate?: No Blood in urine?: No Urinary tract infection?: No Sexually transmitted disease?: No Injury to kidneys or bladder?: No Painful intercourse?: No Weak stream?: No Currently pregnant?: No Vaginal bleeding?: No Last menstrual period?: n  Gastrointestinal Nausea?: No Vomiting?: No Indigestion/heartburn?: No Diarrhea?: No Constipation?: No  Constitutional Fever: No Night sweats?: No Weight loss?: No Fatigue?: No  Skin Skin rash/lesions?: No Itching?: No  Eyes Blurred vision?: No Double vision?: No  Ears/Nose/Throat Sore throat?: No Sinus problems?: No  Hematologic/Lymphatic Swollen glands?: No Easy bruising?:  No  Cardiovascular Leg swelling?: No Chest pain?: No  Respiratory Cough?: No Shortness of breath?: No  Endocrine Excessive thirst?: No  Musculoskeletal Back pain?: No Joint pain?: No  Neurological Headaches?: No Dizziness?: No  Psychologic Depression?: No Anxiety?: No  Physical Exam: BP 111/72   Pulse 76   Ht 5\' 2"  (1.575 m)   Wt 168 lb 8 oz (76.4 kg)   BMI 30.82 kg/m     Laboratory Data: Lab Results  Component Value Date   WBC 7.9 06/30/2014   HGB 10.5 (L) 07/01/2014   HCT 33.5 (L) 06/30/2014   MCV 89 06/30/2014   PLT 174 06/30/2014    Lab Results  Component Value Date   CREATININE 0.57 (L) 06/30/2014    No results found for: PSA  No results found for: TESTOSTERONE  No results found for: HGBA1C  Urinalysis    Component Value Date/Time   COLORURINE Yellow 06/28/2014 1216  APPEARANCEUR Cloudy (A) 07/11/2016 1409   LABSPEC 1.004 06/28/2014 1216   PHURINE 7.0 06/28/2014 1216   GLUCOSEU Negative 07/11/2016 1409   GLUCOSEU Negative 06/28/2014 1216   HGBUR Negative 06/28/2014 1216   BILIRUBINUR Negative 07/11/2016 1409   BILIRUBINUR Negative 06/28/2014 1216   KETONESUR Negative 06/28/2014 1216   PROTEINUR Negative 07/11/2016 1409   PROTEINUR Negative 06/28/2014 1216   NITRITE Positive (A) 07/11/2016 1409   NITRITE Positive 06/28/2014 1216   LEUKOCYTESUR 1+ (A) 07/11/2016 1409   LEUKOCYTESUR 3+ 06/28/2014 1216    Pertinent Imaging: none  Assessment & Plan:  I will see her in 4 months on daily trimethoprim. She will not try the beta 3 agonists at this stage. She was a little bit concerned about taking too many medications  1. Recurrent UTI  - Urinalysis, Complete  2. Urinary retention  - Urinalysis, Complete   No Follow-up on file.  Reece Packer, MD  [facility] [address], [place], [place] [address] [phone]

## 2016-11-08 DIAGNOSIS — E782 Mixed hyperlipidemia: Secondary | ICD-10-CM | POA: Diagnosis not present

## 2016-11-08 DIAGNOSIS — E039 Hypothyroidism, unspecified: Secondary | ICD-10-CM | POA: Diagnosis not present

## 2016-11-08 DIAGNOSIS — Z23 Encounter for immunization: Secondary | ICD-10-CM | POA: Diagnosis not present

## 2016-11-08 DIAGNOSIS — M109 Gout, unspecified: Secondary | ICD-10-CM | POA: Diagnosis not present

## 2016-11-08 DIAGNOSIS — M3320 Polymyositis, organ involvement unspecified: Secondary | ICD-10-CM | POA: Diagnosis not present

## 2016-11-14 DIAGNOSIS — Z96641 Presence of right artificial hip joint: Secondary | ICD-10-CM | POA: Diagnosis not present

## 2016-11-14 DIAGNOSIS — M25561 Pain in right knee: Secondary | ICD-10-CM | POA: Diagnosis not present

## 2016-11-14 DIAGNOSIS — M25551 Pain in right hip: Secondary | ICD-10-CM | POA: Diagnosis not present

## 2016-11-17 DIAGNOSIS — M25551 Pain in right hip: Secondary | ICD-10-CM | POA: Diagnosis not present

## 2016-11-17 DIAGNOSIS — Z96641 Presence of right artificial hip joint: Secondary | ICD-10-CM | POA: Diagnosis not present

## 2016-11-17 DIAGNOSIS — M25561 Pain in right knee: Secondary | ICD-10-CM | POA: Diagnosis not present

## 2016-11-22 DIAGNOSIS — M25551 Pain in right hip: Secondary | ICD-10-CM | POA: Diagnosis not present

## 2016-11-22 DIAGNOSIS — Z96641 Presence of right artificial hip joint: Secondary | ICD-10-CM | POA: Diagnosis not present

## 2016-11-22 DIAGNOSIS — M25561 Pain in right knee: Secondary | ICD-10-CM | POA: Diagnosis not present

## 2016-11-24 DIAGNOSIS — Z96641 Presence of right artificial hip joint: Secondary | ICD-10-CM | POA: Diagnosis not present

## 2016-11-24 DIAGNOSIS — M25561 Pain in right knee: Secondary | ICD-10-CM | POA: Diagnosis not present

## 2016-11-24 DIAGNOSIS — M25551 Pain in right hip: Secondary | ICD-10-CM | POA: Diagnosis not present

## 2016-11-29 DIAGNOSIS — Z96641 Presence of right artificial hip joint: Secondary | ICD-10-CM | POA: Diagnosis not present

## 2016-11-29 DIAGNOSIS — M25561 Pain in right knee: Secondary | ICD-10-CM | POA: Diagnosis not present

## 2016-11-29 DIAGNOSIS — M25551 Pain in right hip: Secondary | ICD-10-CM | POA: Diagnosis not present

## 2016-12-05 DIAGNOSIS — Z96641 Presence of right artificial hip joint: Secondary | ICD-10-CM | POA: Diagnosis not present

## 2016-12-05 DIAGNOSIS — M25561 Pain in right knee: Secondary | ICD-10-CM | POA: Diagnosis not present

## 2016-12-05 DIAGNOSIS — M25551 Pain in right hip: Secondary | ICD-10-CM | POA: Diagnosis not present

## 2016-12-05 NOTE — Progress Notes (Deleted)
12/07/2016 12:59 PM   Cassidy Bell Nov 02, 1940 [id number]  Referring provider: Merrilee Seashore, [place], [place] [address]  No chief complaint on file.   HPI: 76 yo WF with urge incontinence who presents today for a 4 month follow up.  Background history The patient has urgency incontinence. She sometimes leaks with coughing and sneezing but not bending and lifting. She has mild enuresis. She voids every 1 hour and gets up once at night. She wears 1-2 pads per day. The volume loss is larger if she holds it too long.  She can feel and see prolapse and sometimes reduces it. She does no splinting maneuvers. She has had a previous bladder suspension. He no longer has a pessary.  nfrequently she gets a urinary tract infection with typical cystitis symptoms.  Moderate grade 3 cystocele with central defect. Her vaginal cuff descended from 8 or 9 cm to approximately 4 or 5 cm. She had mild hyper mobility of the bladder neck and no stress incontinence with the cystocele reduced. She had no rectocele.  Patient has mixed incontinence that is mild but primarily urgency incontinence. She has hourly frequency and mild nocturia. She has mild enuresis. She has symptomatic prolapse.  She is most bothered by the frequent bladder. I drew her a picture. If she was ever considering surgery I would order urodynamics. If she ever had surgery she would likely best benefit from a transvaginal vault suspension with cystocele repair and graft.  During the last visit we noted treatment goals in regard to prolapse surgery. She had trouble to urinate on his Vesicare and oxybutynin. She had had 5 bladder infections and I placed her on trimethoprim. Her renal ultrasound was normal and she was clinically infection free and may have abated residuals of 200 mL. Last visit she was given the beta 3 agonist 25 mg. She understands a prolapse surgery will not help her bothersome frequency.  Theoretically improvement of residual urine volume could help her frequency.  Today Clinically noninfected on daily trimethoprim. The patient really thinks that her frequency is improving and never tried the beta 3 agonist possibly for this reason but also she was having a lot of knee issues.   PMH: Past Medical History:  Diagnosis Date  . Arthritis   . Arthritis   . Atrophic vaginitis   . Bladder prolapse, female, acquired   . Hypertension   . Neuromuscular disorder ([place])   . Urinary retention     Surgical History: Past Surgical History:  Procedure Laterality Date  . ABDOMINAL HYSTERECTOMY    . bladder tach  1993  . CARPAL TUNNEL RELEASE    . COLONOSCOPY    . MUSCLE BIOPSY    . REPLACEMENT TOTAL HIP W/  RESURFACING IMPLANTS  1996   Right & Left     Home Medications:  Allergies as of 12/07/2016      Reactions   Bactrim [sulfamethoxazole-trimethoprim] Nausea And Vomiting   Pollen Extract Other (See Comments)   respiratory   Sulfa Antibiotics Rash   Other reaction(s): Other (See Comments) respiratory And GI Upset   Sulfacetamide Sodium Rash   And GI Upset   Sulfamethoxazole-trimethoprim Rash      Medication List       Accurate as of 12/05/16 12:59 PM. Always use your most recent med list.          acetaminophen 500 MG tablet Commonly known as:  TYLENOL Take 500 mg by mouth.   aspirin  81 MG tablet Take 81 mg by mouth daily.   benazepril 10 MG tablet Commonly known as:  LOTENSIN Take 10 mg by mouth daily.   CALCIUM 600 + D PO Take by mouth. Reported on 08/25/2015   cetirizine 10 MG tablet Commonly known as:  ZYRTEC Take 10 mg by mouth daily.   conjugated estrogens vaginal cream Commonly known as:  PREMARIN Place 1 Applicatorful vaginally daily. Apply 0.5mg  (pea-sized amount)  just inside the vaginal introitus with a finger-tip every night for two weeks and then Monday, Wednesday and Friday nights.   fluticasone 50 MCG/ACT nasal spray Commonly known  as:  FLONASE Place 2 sprays into the nose daily.   levothyroxine 75 MCG tablet Commonly known as:  SYNTHROID, LEVOTHROID   multivitamin tablet Take 1 tablet by mouth daily.   trimethoprim 100 MG tablet Commonly known as:  TRIMPEX Take 1 tablet (100 mg total) by mouth daily.   Vitamin D 2000 units Caps Take by mouth.       Allergies:  Allergies  Allergen Reactions  . Bactrim [Sulfamethoxazole-Trimethoprim] Nausea And Vomiting  . Pollen Extract Other (See Comments)    respiratory  . Sulfa Antibiotics Rash    Other reaction(s): Other (See Comments) respiratory And GI Upset  . Sulfacetamide Sodium Rash    And GI Upset  . Sulfamethoxazole-Trimethoprim Rash    Family History: Family History  Problem Relation Age of Onset  . Cancer Father        colon  . Kidney cancer Neg Hx   . Renal cancer Neg Hx   . Bladder Cancer Neg Hx   . Kidney disease Neg Hx     Social History:  reports that she has never smoked. She has never used smokeless tobacco. She reports that she does not drink alcohol or use drugs.  ROS:                                        Physical Exam: There were no vitals taken for this visit.    Laboratory Data: Lab Results  Component Value Date   WBC 7.9 06/30/2014   HGB 10.5 (L) 07/01/2014   HCT 33.5 (L) 06/30/2014   MCV 89 06/30/2014   PLT 174 06/30/2014    Lab Results  Component Value Date   CREATININE 0.57 (L) 06/30/2014     Urinalysis    Component Value Date/Time   COLORURINE Yellow 06/28/2014 1216   APPEARANCEUR Cloudy (A) 07/11/2016 1409   LABSPEC 1.004 06/28/2014 1216   PHURINE 7.0 06/28/2014 1216   GLUCOSEU Negative 07/11/2016 1409   GLUCOSEU Negative 06/28/2014 1216   HGBUR Negative 06/28/2014 1216   BILIRUBINUR Negative 07/11/2016 1409   BILIRUBINUR Negative 06/28/2014 1216   KETONESUR Negative 06/28/2014 1216   PROTEINUR Negative 07/11/2016 1409   PROTEINUR Negative 06/28/2014 1216   NITRITE  Positive (A) 07/11/2016 1409   NITRITE Positive 06/28/2014 1216   LEUKOCYTESUR 1+ (A) 07/11/2016 1409   LEUKOCYTESUR 3+ 06/28/2014 1216    Pertinent Imaging: none  Assessment & Plan:  I will see her in 4 months on daily trimethoprim. She will not try the beta 3 agonists at this stage. She was a little bit concerned about taking too many medications  1. Recurrent UTI  - Urinalysis, Complete  2. Urinary retention  - Urinalysis, Complete   No Follow-up on file.  [name], [facility] [address], [place], [place] [address] [phone]

## 2016-12-07 ENCOUNTER — Ambulatory Visit: Payer: Medicare Other | Admitting: Urology

## 2016-12-07 DIAGNOSIS — M25551 Pain in right hip: Secondary | ICD-10-CM | POA: Diagnosis not present

## 2016-12-07 DIAGNOSIS — M25561 Pain in right knee: Secondary | ICD-10-CM | POA: Diagnosis not present

## 2016-12-07 DIAGNOSIS — Z96641 Presence of right artificial hip joint: Secondary | ICD-10-CM | POA: Diagnosis not present

## 2016-12-12 DIAGNOSIS — M25551 Pain in right hip: Secondary | ICD-10-CM | POA: Diagnosis not present

## 2016-12-12 DIAGNOSIS — Z96641 Presence of right artificial hip joint: Secondary | ICD-10-CM | POA: Diagnosis not present

## 2016-12-12 DIAGNOSIS — M25561 Pain in right knee: Secondary | ICD-10-CM | POA: Diagnosis not present

## 2016-12-13 ENCOUNTER — Ambulatory Visit
Admission: RE | Admit: 2016-12-13 | Discharge: 2016-12-13 | Disposition: A | Discharge status: Home / Self Care | Payer: Medicare Other | Source: Ambulatory Visit | Attending: Internal Medicine | Admitting: Internal Medicine

## 2016-12-13 DIAGNOSIS — Z1231 Encounter for screening mammogram for malignant neoplasm of breast: Secondary | ICD-10-CM | POA: Diagnosis not present

## 2016-12-14 DIAGNOSIS — M25561 Pain in right knee: Secondary | ICD-10-CM | POA: Diagnosis not present

## 2016-12-14 DIAGNOSIS — Z96641 Presence of right artificial hip joint: Secondary | ICD-10-CM | POA: Diagnosis not present

## 2016-12-14 DIAGNOSIS — M25551 Pain in right hip: Secondary | ICD-10-CM | POA: Diagnosis not present

## 2016-12-20 DIAGNOSIS — M25561 Pain in right knee: Secondary | ICD-10-CM | POA: Diagnosis not present

## 2016-12-20 DIAGNOSIS — M25551 Pain in right hip: Secondary | ICD-10-CM | POA: Diagnosis not present

## 2016-12-20 DIAGNOSIS — Z96641 Presence of right artificial hip joint: Secondary | ICD-10-CM | POA: Diagnosis not present

## 2016-12-22 DIAGNOSIS — M25561 Pain in right knee: Secondary | ICD-10-CM | POA: Diagnosis not present

## 2016-12-22 DIAGNOSIS — Z96641 Presence of right artificial hip joint: Secondary | ICD-10-CM | POA: Diagnosis not present

## 2016-12-22 DIAGNOSIS — M25551 Pain in right hip: Secondary | ICD-10-CM | POA: Diagnosis not present

## 2016-12-26 DIAGNOSIS — M25561 Pain in right knee: Secondary | ICD-10-CM | POA: Diagnosis not present

## 2016-12-26 DIAGNOSIS — Z96641 Presence of right artificial hip joint: Secondary | ICD-10-CM | POA: Diagnosis not present

## 2016-12-26 DIAGNOSIS — M25551 Pain in right hip: Secondary | ICD-10-CM | POA: Diagnosis not present

## 2016-12-28 DIAGNOSIS — Z96641 Presence of right artificial hip joint: Secondary | ICD-10-CM | POA: Diagnosis not present

## 2016-12-28 DIAGNOSIS — M25551 Pain in right hip: Secondary | ICD-10-CM | POA: Diagnosis not present

## 2016-12-28 DIAGNOSIS — M25561 Pain in right knee: Secondary | ICD-10-CM | POA: Diagnosis not present

## 2017-01-27 DIAGNOSIS — H2513 Age-related nuclear cataract, bilateral: Secondary | ICD-10-CM | POA: Diagnosis not present

## 2017-02-13 DIAGNOSIS — E039 Hypothyroidism, unspecified: Secondary | ICD-10-CM | POA: Diagnosis not present

## 2017-02-13 DIAGNOSIS — Z888 Allergy status to other drugs, medicaments and biological substances status: Secondary | ICD-10-CM | POA: Diagnosis not present

## 2017-02-13 DIAGNOSIS — Z96641 Presence of right artificial hip joint: Secondary | ICD-10-CM | POA: Diagnosis not present

## 2017-02-13 DIAGNOSIS — M47818 Spondylosis without myelopathy or radiculopathy, sacral and sacrococcygeal region: Secondary | ICD-10-CM | POA: Diagnosis not present

## 2017-02-13 DIAGNOSIS — Z9889 Other specified postprocedural states: Secondary | ICD-10-CM | POA: Diagnosis not present

## 2017-02-13 DIAGNOSIS — Z471 Aftercare following joint replacement surgery: Secondary | ICD-10-CM | POA: Diagnosis not present

## 2017-02-13 DIAGNOSIS — Z96643 Presence of artificial hip joint, bilateral: Secondary | ICD-10-CM | POA: Diagnosis not present

## 2017-02-13 DIAGNOSIS — Z8739 Personal history of other diseases of the musculoskeletal system and connective tissue: Secondary | ICD-10-CM | POA: Diagnosis not present

## 2017-02-13 DIAGNOSIS — I10 Essential (primary) hypertension: Secondary | ICD-10-CM | POA: Diagnosis not present

## 2017-02-13 DIAGNOSIS — Z882 Allergy status to sulfonamides status: Secondary | ICD-10-CM | POA: Diagnosis not present

## 2017-03-06 ENCOUNTER — Encounter: Payer: Self-pay | Admitting: Urology

## 2017-03-06 ENCOUNTER — Ambulatory Visit (INDEPENDENT_AMBULATORY_CARE_PROVIDER_SITE_OTHER): Payer: Medicare Other | Admitting: Urology

## 2017-03-06 VITALS — BP 118/75 | HR 82 | Ht 62.0 in | Wt 170.7 lb

## 2017-03-06 DIAGNOSIS — N390 Urinary tract infection, site not specified: Secondary | ICD-10-CM

## 2017-03-06 DIAGNOSIS — N952 Postmenopausal atrophic vaginitis: Secondary | ICD-10-CM

## 2017-03-06 DIAGNOSIS — N3946 Mixed incontinence: Secondary | ICD-10-CM | POA: Diagnosis not present

## 2017-03-06 LAB — URINALYSIS, COMPLETE
BILIRUBIN UA: NEGATIVE
Glucose, UA: NEGATIVE
Ketones, UA: NEGATIVE
Nitrite, UA: NEGATIVE
PH UA: 6 (ref 5.0–7.5)
Protein, UA: NEGATIVE
RBC UA: NEGATIVE
Specific Gravity, UA: 1.015 (ref 1.005–1.030)
UUROB: 0.2 mg/dL (ref 0.2–1.0)

## 2017-03-06 LAB — BLADDER SCAN AMB NON-IMAGING: SCAN RESULT: 0

## 2017-03-06 LAB — MICROSCOPIC EXAMINATION

## 2017-03-06 MED ORDER — TRIMETHOPRIM 100 MG PO TABS: 100.0000 mg | ORAL_TABLET | Freq: Every day | ORAL | 11 refills | Status: DC

## 2017-03-06 MED ORDER — ESTROGENS, CONJUGATED 0.625 MG/GM VA CREA: 1.0000 | TOPICAL_CREAM | Freq: Every day | VAGINAL | 12 refills | Status: DC

## 2017-03-06 NOTE — Progress Notes (Signed)
03/06/2017 11:06 AM   Cassidy Bell Dec 22, 1940 [id number]  Referring provider: Merrilee Seashore, [place], [place] [address]  Chief Complaint  Patient presents with  . Recurrent UTI    HPI: I dictated along the last time. She has been taking daily trimethoprim. She has prolapse noted. Frequency was improving and she never took the beta 3 agonists.  Today Frequency is stable. Clinically no infection here prolapse stable. She still does not want surgery at this stage     PMH: Past Medical History:  Diagnosis Date  . Arthritis   . Arthritis   . Atrophic vaginitis   . Bladder prolapse, female, acquired   . Hypertension   . Neuromuscular disorder ([place])   . Urinary retention     Surgical History: Past Surgical History:  Procedure Laterality Date  . ABDOMINAL HYSTERECTOMY    . bladder tach  1993  . CARPAL TUNNEL RELEASE    . COLONOSCOPY    . MUSCLE BIOPSY    . REPLACEMENT TOTAL HIP W/  RESURFACING IMPLANTS  1996   Right & Left     Home Medications:  Allergies as of 03/06/2017      Reactions   Bactrim [sulfamethoxazole-trimethoprim] Nausea And Vomiting   Pollen Extract Other (See Comments)   respiratory   Sulfa Antibiotics Rash   Other reaction(s): Other (See Comments) respiratory And GI Upset   Sulfacetamide Sodium Rash   And GI Upset   Sulfamethoxazole-trimethoprim Rash      Medication List       Accurate as of 03/06/17 11:06 AM. Always use your most recent med list.          acetaminophen 500 MG tablet Commonly known as:  TYLENOL Take 500 mg by mouth.   aspirin 81 MG tablet Take 81 mg by mouth daily.   benazepril 10 MG tablet Commonly known as:  LOTENSIN Take 10 mg by mouth daily.   CALCIUM 600 + D PO Take by mouth. Reported on 08/25/2015   cetirizine 10 MG tablet Commonly known as:  ZYRTEC Take 10 mg by mouth daily.   conjugated estrogens vaginal cream Commonly known as:  PREMARIN Place 1  Applicatorful vaginally daily. Apply 0.5mg  (pea-sized amount)  just inside the vaginal introitus with a finger-tip every night for two weeks and then Monday, Wednesday and Friday nights.   fluticasone 50 MCG/ACT nasal spray Commonly known as:  FLONASE Place 2 sprays into the nose daily.   levothyroxine 75 MCG tablet Commonly known as:  SYNTHROID, LEVOTHROID   multivitamin tablet Take 1 tablet by mouth daily.   trimethoprim 100 MG tablet Commonly known as:  TRIMPEX Take 1 tablet (100 mg total) by mouth daily.   Vitamin D 2000 units Caps Take by mouth.       Allergies:  Allergies  Allergen Reactions  . Bactrim [Sulfamethoxazole-Trimethoprim] Nausea And Vomiting  . Pollen Extract Other (See Comments)    respiratory  . Sulfa Antibiotics Rash    Other reaction(s): Other (See Comments) respiratory And GI Upset  . Sulfacetamide Sodium Rash    And GI Upset  . Sulfamethoxazole-Trimethoprim Rash    Family History: Family History  Problem Relation Age of Onset  . Cancer Father        colon  . Kidney cancer Neg Hx   . Renal cancer Neg Hx   . Bladder Cancer Neg Hx   . Kidney disease Neg Hx   . Breast cancer Neg Hx  Social History:  reports that she has never smoked. She has never used smokeless tobacco. She reports that she does not drink alcohol or use drugs.  ROS: UROLOGY Frequent Urination?: Yes Hard to postpone urination?: No Burning/pain with urination?: No Get up at night to urinate?: Yes Leakage of urine?: No Urine stream starts and stops?: No Trouble starting stream?: No Do you have to strain to urinate?: No Blood in urine?: No Urinary tract infection?: No Sexually transmitted disease?: No Injury to kidneys or bladder?: No Painful intercourse?: No Weak stream?: No Currently pregnant?: No Vaginal bleeding?: No Last menstrual period?: n  Gastrointestinal Nausea?: No Vomiting?: No Indigestion/heartburn?: No Diarrhea?: No Constipation?:  No  Constitutional Fever: No Night sweats?: No Weight loss?: No Fatigue?: No  Skin Skin rash/lesions?: No Itching?: No  Eyes Blurred vision?: No Double vision?: No  Ears/Nose/Throat Sore throat?: No Sinus problems?: No  Hematologic/Lymphatic Swollen glands?: No Easy bruising?: No  Cardiovascular Leg swelling?: No Chest pain?: No  Respiratory Cough?: No Shortness of breath?: No  Endocrine Excessive thirst?: No  Musculoskeletal Back pain?: No Joint pain?: No  Neurological Headaches?: No Dizziness?: No  Psychologic Depression?: No Anxiety?: No  Physical Exam: BP 118/75 (BP Location: Left Arm, Patient Position: Sitting, Cuff Size: Normal)   Pulse 82   Ht 5\' 2"  (1.575 m)   Wt 170 lb 11.2 oz (77.4 kg)   BMI 31.22 kg/m   Constitutional:  Alert and oriented, No acute distress.   Laboratory Data: Lab Results  Component Value Date   WBC 7.9 06/30/2014   HGB 10.5 (L) 07/01/2014   HCT 33.5 (L) 06/30/2014   MCV 89 06/30/2014   PLT 174 06/30/2014    Lab Results  Component Value Date   CREATININE 0.57 (L) 06/30/2014    No results found for: PSA  No results found for: TESTOSTERONE  No results found for: HGBA1C  Urinalysis    Component Value Date/Time   COLORURINE Yellow 06/28/2014 1216   APPEARANCEUR Cloudy (A) 07/11/2016 1409   LABSPEC 1.004 06/28/2014 1216   PHURINE 7.0 06/28/2014 1216   GLUCOSEU Negative 07/11/2016 1409   GLUCOSEU Negative 06/28/2014 1216   HGBUR Negative 06/28/2014 1216   BILIRUBINUR Negative 07/11/2016 1409   BILIRUBINUR Negative 06/28/2014 1216   KETONESUR Negative 06/28/2014 1216   PROTEINUR Negative 07/11/2016 1409   PROTEINUR Negative 06/28/2014 1216   NITRITE Positive (A) 07/11/2016 1409   NITRITE Positive 06/28/2014 1216   LEUKOCYTESUR 1+ (A) 07/11/2016 1409   LEUKOCYTESUR 3+ 06/28/2014 1216    Pertinent Imaging: None  Assessment & Plan:  The patient may have prolapse surgery in the future but is  somewhat reluctant. Premarin prescription and trimethoprim renewed and I will see her in 1 year  1. Recurrent UTI  - Urinalysis, Complete  2. Mixed incontinence  - Bladder Scan (Post Void Residual) in office   No Follow-up on file.  Reece Packer, MD  [facility] [address], [place], [place] [address] [phone]

## 2017-04-27 DIAGNOSIS — Z23 Encounter for immunization: Secondary | ICD-10-CM | POA: Diagnosis not present

## 2017-05-16 DIAGNOSIS — E782 Mixed hyperlipidemia: Secondary | ICD-10-CM | POA: Diagnosis not present

## 2017-05-16 DIAGNOSIS — E039 Hypothyroidism, unspecified: Secondary | ICD-10-CM | POA: Diagnosis not present

## 2017-05-23 DIAGNOSIS — E6609 Other obesity due to excess calories: Secondary | ICD-10-CM | POA: Diagnosis not present

## 2017-05-23 DIAGNOSIS — M150 Primary generalized (osteo)arthritis: Secondary | ICD-10-CM | POA: Diagnosis not present

## 2017-05-23 DIAGNOSIS — E039 Hypothyroidism, unspecified: Secondary | ICD-10-CM | POA: Diagnosis not present

## 2017-05-23 DIAGNOSIS — M109 Gout, unspecified: Secondary | ICD-10-CM | POA: Diagnosis not present

## 2017-05-23 DIAGNOSIS — M3320 Polymyositis, organ involvement unspecified: Secondary | ICD-10-CM | POA: Diagnosis not present

## 2017-05-23 DIAGNOSIS — E782 Mixed hyperlipidemia: Secondary | ICD-10-CM | POA: Diagnosis not present

## 2017-05-23 DIAGNOSIS — Z780 Asymptomatic menopausal state: Secondary | ICD-10-CM | POA: Diagnosis not present

## 2017-05-23 DIAGNOSIS — I10 Essential (primary) hypertension: Secondary | ICD-10-CM | POA: Diagnosis not present

## 2017-07-27 ENCOUNTER — Other Ambulatory Visit: Payer: Self-pay

## 2017-07-27 MED ORDER — TRIMETHOPRIM 100 MG PO TABS: 100.0000 mg | ORAL_TABLET | Freq: Every day | ORAL | 11 refills | Status: DC

## 2017-08-22 ENCOUNTER — Other Ambulatory Visit: Payer: Self-pay | Admitting: Internal Medicine

## 2017-08-22 DIAGNOSIS — Z1231 Encounter for screening mammogram for malignant neoplasm of breast: Secondary | ICD-10-CM

## 2017-11-07 DIAGNOSIS — E039 Hypothyroidism, unspecified: Secondary | ICD-10-CM | POA: Diagnosis not present

## 2017-11-07 DIAGNOSIS — E782 Mixed hyperlipidemia: Secondary | ICD-10-CM | POA: Diagnosis not present

## 2017-11-07 DIAGNOSIS — N390 Urinary tract infection, site not specified: Secondary | ICD-10-CM | POA: Diagnosis not present

## 2017-11-07 DIAGNOSIS — Z0000 Encounter for general adult medical examination without abnormal findings: Secondary | ICD-10-CM | POA: Diagnosis not present

## 2017-11-07 DIAGNOSIS — E6609 Other obesity due to excess calories: Secondary | ICD-10-CM | POA: Diagnosis not present

## 2017-11-07 DIAGNOSIS — M3320 Polymyositis, organ involvement unspecified: Secondary | ICD-10-CM | POA: Diagnosis not present

## 2017-11-07 DIAGNOSIS — Z780 Asymptomatic menopausal state: Secondary | ICD-10-CM | POA: Diagnosis not present

## 2017-11-14 DIAGNOSIS — E6609 Other obesity due to excess calories: Secondary | ICD-10-CM | POA: Diagnosis not present

## 2017-11-14 DIAGNOSIS — M109 Gout, unspecified: Secondary | ICD-10-CM | POA: Diagnosis not present

## 2017-11-14 DIAGNOSIS — N3281 Overactive bladder: Secondary | ICD-10-CM | POA: Diagnosis not present

## 2017-11-14 DIAGNOSIS — I10 Essential (primary) hypertension: Secondary | ICD-10-CM | POA: Diagnosis not present

## 2017-11-14 DIAGNOSIS — M150 Primary generalized (osteo)arthritis: Secondary | ICD-10-CM | POA: Diagnosis not present

## 2017-11-14 DIAGNOSIS — E782 Mixed hyperlipidemia: Secondary | ICD-10-CM | POA: Diagnosis not present

## 2017-11-14 DIAGNOSIS — E039 Hypothyroidism, unspecified: Secondary | ICD-10-CM | POA: Diagnosis not present

## 2017-11-14 DIAGNOSIS — M3320 Polymyositis, organ involvement unspecified: Secondary | ICD-10-CM | POA: Diagnosis not present

## 2017-12-14 ENCOUNTER — Ambulatory Visit
Admission: RE | Admit: 2017-12-14 | Discharge: 2017-12-14 | Disposition: A | Discharge status: Home / Self Care | Payer: Medicare Other | Source: Ambulatory Visit | Attending: Internal Medicine | Admitting: Internal Medicine

## 2017-12-14 DIAGNOSIS — Z1231 Encounter for screening mammogram for malignant neoplasm of breast: Secondary | ICD-10-CM

## 2018-01-08 DIAGNOSIS — H2513 Age-related nuclear cataract, bilateral: Secondary | ICD-10-CM | POA: Diagnosis not present

## 2018-02-19 DIAGNOSIS — Z96641 Presence of right artificial hip joint: Secondary | ICD-10-CM | POA: Diagnosis not present

## 2018-02-19 DIAGNOSIS — Z471 Aftercare following joint replacement surgery: Secondary | ICD-10-CM | POA: Diagnosis not present

## 2018-03-05 ENCOUNTER — Ambulatory Visit: Payer: Medicare Other | Admitting: Urology

## 2018-03-06 DIAGNOSIS — H2512 Age-related nuclear cataract, left eye: Secondary | ICD-10-CM | POA: Diagnosis not present

## 2018-03-15 ENCOUNTER — Encounter: Payer: Self-pay | Admitting: *Deleted

## 2018-03-15 ENCOUNTER — Other Ambulatory Visit: Payer: Self-pay

## 2018-03-15 NOTE — Discharge Instructions (Signed)

## 2018-03-21 ENCOUNTER — Encounter
Admission: RE | Disposition: A | Discharge status: Home / Self Care | Payer: Self-pay | Source: Ambulatory Visit | Attending: Ophthalmology

## 2018-03-21 ENCOUNTER — Ambulatory Visit
Admission: RE | Admit: 2018-03-21 | Discharge: 2018-03-21 | Disposition: A | Discharge status: Home / Self Care | Payer: Medicare Other | Source: Ambulatory Visit | Attending: Ophthalmology | Admitting: Ophthalmology

## 2018-03-21 ENCOUNTER — Ambulatory Visit: Payer: Medicare Other | Admitting: Anesthesiology

## 2018-03-21 DIAGNOSIS — Z79890 Hormone replacement therapy (postmenopausal): Secondary | ICD-10-CM | POA: Diagnosis not present

## 2018-03-21 DIAGNOSIS — H2512 Age-related nuclear cataract, left eye: Secondary | ICD-10-CM | POA: Diagnosis not present

## 2018-03-21 DIAGNOSIS — Z7951 Long term (current) use of inhaled steroids: Secondary | ICD-10-CM | POA: Diagnosis not present

## 2018-03-21 DIAGNOSIS — I10 Essential (primary) hypertension: Secondary | ICD-10-CM | POA: Diagnosis not present

## 2018-03-21 DIAGNOSIS — H25812 Combined forms of age-related cataract, left eye: Secondary | ICD-10-CM | POA: Diagnosis not present

## 2018-03-21 DIAGNOSIS — Z79899 Other long term (current) drug therapy: Secondary | ICD-10-CM | POA: Insufficient documentation

## 2018-03-21 DIAGNOSIS — E039 Hypothyroidism, unspecified: Secondary | ICD-10-CM | POA: Diagnosis not present

## 2018-03-21 HISTORY — DX: Polymyositis, organ involvement unspecified: M33.20

## 2018-03-21 HISTORY — DX: Presence of dental prosthetic device (complete) (partial): Z97.2

## 2018-03-21 HISTORY — PX: CATARACT EXTRACTION W/PHACO: SHX586

## 2018-03-21 HISTORY — DX: Hypothyroidism, unspecified: E03.9

## 2018-03-21 SURGERY — PHACOEMULSIFICATION, CATARACT, WITH IOL INSERTION
Anesthesia: Monitor Anesthesia Care | Site: Eye | Laterality: Left | Wound class: Clean

## 2018-03-21 MED ORDER — LACTATED RINGERS IV SOLN: 10.0000 mL/h | INTRAVENOUS | Status: DC

## 2018-03-21 MED ORDER — ARMC OPHTHALMIC DILATING DROPS
1.0000 "application " | OPHTHALMIC | Status: DC | PRN
  Administered 2018-03-21 (×3): 1 via OPHTHALMIC

## 2018-03-21 MED ORDER — BRIMONIDINE TARTRATE-TIMOLOL 0.2-0.5 % OP SOLN
OPHTHALMIC | Status: DC | PRN
  Administered 2018-03-21: 1 [drp] via OPHTHALMIC

## 2018-03-21 MED ORDER — LIDOCAINE HCL (PF) 2 % IJ SOLN
INTRAOCULAR | Status: DC | PRN
  Administered 2018-03-21: 1 mL

## 2018-03-21 MED ORDER — CEFUROXIME OPHTHALMIC INJECTION 1 MG/0.1 ML
INJECTION | OPHTHALMIC | Status: DC | PRN
  Administered 2018-03-21: 0.1 mL via INTRACAMERAL

## 2018-03-21 MED ORDER — FENTANYL CITRATE (PF) 100 MCG/2ML IJ SOLN
INTRAMUSCULAR | Status: DC | PRN
  Administered 2018-03-21: 50 ug via INTRAVENOUS

## 2018-03-21 MED ORDER — MIDAZOLAM HCL 2 MG/2ML IJ SOLN
INTRAMUSCULAR | Status: DC | PRN
  Administered 2018-03-21: 2 mg via INTRAVENOUS

## 2018-03-21 MED ORDER — EPINEPHRINE PF 1 MG/ML IJ SOLN
INTRAOCULAR | Status: DC | PRN
  Administered 2018-03-21: 67 mL via OPHTHALMIC

## 2018-03-21 MED ORDER — NA HYALUR & NA CHOND-NA HYALUR 0.4-0.35 ML IO KIT
PACK | INTRAOCULAR | Status: DC | PRN
  Administered 2018-03-21: 1 mL via INTRAOCULAR

## 2018-03-21 MED ORDER — MOXIFLOXACIN HCL 0.5 % OP SOLN
1.0000 [drp] | OPHTHALMIC | Status: DC | PRN
  Administered 2018-03-21 (×3): 1 [drp] via OPHTHALMIC

## 2018-03-21 MED ORDER — ONDANSETRON HCL 4 MG/2ML IJ SOLN: 4.0000 mg | Freq: Once | INTRAMUSCULAR | Status: DC | PRN

## 2018-03-21 SURGICAL SUPPLY — 20 items
CANNULA ANT/CHMB 27G (MISCELLANEOUS) ×1 IMPLANT
CANNULA ANT/CHMB 27GA (MISCELLANEOUS) ×3 IMPLANT
GLOVE SURG LX 7.5 STRW (GLOVE) ×2
GLOVE SURG LX STRL 7.5 STRW (GLOVE) ×1 IMPLANT
GLOVE SURG TRIUMPH 8.0 PF LTX (GLOVE) ×3 IMPLANT
GOWN STRL REUS W/ TWL LRG LVL3 (GOWN DISPOSABLE) ×2 IMPLANT
GOWN STRL REUS W/TWL LRG LVL3 (GOWN DISPOSABLE) ×6
MARKER SKIN DUAL TIP RULER LAB (MISCELLANEOUS) ×3 IMPLANT
NDL FILTER BLUNT 18X1 1/2 (NEEDLE) ×1 IMPLANT
NEEDLE FILTER BLUNT 18X 1/2SAF (NEEDLE) ×2
NEEDLE FILTER BLUNT 18X1 1/2 (NEEDLE) ×1 IMPLANT
PACK CATARACT BRASINGTON (MISCELLANEOUS) ×3 IMPLANT
PACK EYE AFTER SURG (MISCELLANEOUS) ×3 IMPLANT
PACK OPTHALMIC (MISCELLANEOUS) ×3 IMPLANT
SYR 3ML LL SCALE MARK (SYRINGE) ×3 IMPLANT
SYR 5ML LL (SYRINGE) ×3 IMPLANT
SYR TB 1ML LUER SLIP (SYRINGE) ×3 IMPLANT
Toric IOL (Intraocular Lens) ×2 IMPLANT
WATER STERILE IRR 500ML POUR (IV SOLUTION) ×3 IMPLANT
WIPE NON LINTING 3.25X3.25 (MISCELLANEOUS) ×3 IMPLANT

## 2018-03-21 NOTE — H&P (Signed)
The History and Physical notes are on paper, have been signed, and are to be scanned. The patient remains stable and unchanged from the H&P.   Previous H&P reviewed, patient examined, and there are no changes.  Cassidy Bell 03/21/2018 8:01 AM

## 2018-03-21 NOTE — Anesthesia Postprocedure Evaluation (Signed)
Anesthesia Post Note  Patient: Cassidy Bell  Procedure(s) Performed: CATARACT EXTRACTION PHACO AND INTRAOCULAR LENS PLACEMENT (IOC)  LEFT TORIC LENS (Left Eye)  Patient location during evaluation: PACU Anesthesia Type: MAC Level of consciousness: awake and alert Pain management: pain level controlled Vital Signs Assessment: post-procedure vital signs reviewed and stable Respiratory status: spontaneous breathing, nonlabored ventilation, respiratory function stable and patient connected to nasal cannula oxygen Cardiovascular status: stable and blood pressure returned to baseline Postop Assessment: no apparent nausea or vomiting Anesthetic complications: no    Veda Canning

## 2018-03-21 NOTE — Anesthesia Procedure Notes (Signed)
Procedure Name: MAC Date/Time: 03/21/2018 8:24 AM Performed by: Janna Arch, CRNA Pre-anesthesia Checklist: Patient identified, Emergency Drugs available, Suction available and Patient being monitored Patient Re-evaluated:Patient Re-evaluated prior to induction Oxygen Delivery Method: Nasal cannula

## 2018-03-21 NOTE — Transfer of Care (Signed)
Immediate Anesthesia Transfer of Care Note  Patient: Cassidy Bell  Procedure(s) Performed: CATARACT EXTRACTION PHACO AND INTRAOCULAR LENS PLACEMENT (IOC)  LEFT TORIC LENS (Left Eye)  Patient Location: PACU  Anesthesia Type: MAC  Level of Consciousness: awake, alert  and patient cooperative  Airway and Oxygen Therapy: Patient Spontanous Breathing and Patient connected to supplemental oxygen  Post-op Assessment: Post-op Vital signs reviewed, Patient's Cardiovascular Status Stable, Respiratory Function Stable, Patent Airway and No signs of Nausea or vomiting  Post-op Vital Signs: Reviewed and stable  Complications: No apparent anesthesia complications

## 2018-03-21 NOTE — Anesthesia Preprocedure Evaluation (Signed)
Anesthesia Evaluation  Patient identified by MRN, date of birth, ID band Patient awake    Reviewed: Allergy & Precautions, NPO status   Airway Mallampati: II  TM Distance: >3 FB     Dental   Pulmonary    breath sounds clear to auscultation       Cardiovascular hypertension,  Rhythm:Regular Rate:Normal     Neuro/Psych  Neuromuscular disease (hx polymyositis)    GI/Hepatic   Endo/Other  Hypothyroidism BMI 31   Renal/GU      Musculoskeletal  (+) Arthritis ,   Abdominal   Peds  Hematology   Anesthesia Other Findings   Reproductive/Obstetrics                             Anesthesia Physical Anesthesia Plan  ASA: II  Anesthesia Plan: MAC   Post-op Pain Management:    Induction: Intravenous  PONV Risk Score and Plan:   Airway Management Planned: Nasal Cannula  Additional Equipment:   Intra-op Plan:   Post-operative Plan:   Informed Consent: I have reviewed the patients History and Physical, chart, labs and discussed the procedure including the risks, benefits and alternatives for the proposed anesthesia with the patient or authorized representative who has indicated his/her understanding and acceptance.     Plan Discussed with: CRNA  Anesthesia Plan Comments:         Anesthesia Quick Evaluation

## 2018-03-21 NOTE — Op Note (Signed)
LOCATION:  [facility]   PREOPERATIVE DIAGNOSIS:  Nuclear sclerotic cataract of the left eye.  H25.12  POSTOPERATIVE DIAGNOSIS:  Nuclear sclerotic cataract of the left eye.   PROCEDURE:  Phacoemulsification with Toric posterior chamber intraocular lens placement of the left eye.   LENS:  Implant Name Type Inv. Item Serial No. Manufacturer Lot No. LRB No. Used  Toric IOL Intraocular Lens  [id number]   Left 1   [id number] 23.5 D Toric intraocular lens with 2.25 diopters of cylindrical power with axis orientation at 74 degrees.   ULTRASOUND TIME: 16 % of 1 minutes, 10 seconds.  CDE 11.5   SURGEON:  Wyonia Hough, MD   ANESTHESIA:  Topical with tetracaine drops and 2% Xylocaine jelly, augmented with 1% preservative-free intracameral lidocaine.  COMPLICATIONS:  None.   DESCRIPTION OF PROCEDURE:  The patient was identified in the holding room and transported to the operating suite and placed in the supine position under the operating microscope.  The left eye was identified as the operative eye, and it was prepped and draped in the usual sterile ophthalmic fashion.    A clear-corneal paracentesis incision was made at the 1:30 position.  0.5 ml of preservative-free 1% lidocaine was injected into the anterior chamber. The anterior chamber was filled with Viscoat.  A 2.4 millimeter near clear corneal incision was then made at the 10:30 position.  A cystotome and capsulorrhexis forceps were then used to make a curvilinear capsulorrhexis.  Hydrodissection and hydrodelineation were then performed using balanced salt solution.   Phacoemulsification was then used in stop and chop fashion to remove the lens, nucleus and epinucleus.  The remaining cortex was aspirated using the irrigation and aspiration handpiece.  Provisc viscoelastic was then placed into the capsular bag to distend it for lens placement.  The Verion digital marker was used to align the implant at the intended axis.   A  23.5 diopter lens was then injected into the capsular bag.  It was rotated clockwise until the axis marks on the lens were approximately 15 degrees in the counterclockwise direction to the intended alignment.  The viscoelastic was aspirated from the eye using the irrigation aspiration handpiece.  Then, a Koch spatula through the sideport incision was used to rotate the lens in a clockwise direction until the axis markings of the intraocular lens were lined up with the Verion alignment.  Balanced salt solution was then used to hydrate the wounds. Cefuroxime 0.1 ml of a 10mg /ml solution was injected into the anterior chamber for a dose of 1 mg of intracameral antibiotic at the completion of the case.    The eye was noted to have a physiologic pressure and there was no wound leak noted.   Timolol and Brimonidine drops were applied to the eye.  The patient was taken to the recovery room in stable condition having had no complications of anesthesia or surgery.  Arlyn Buerkle 03/21/2018, 8:47 AM

## 2018-03-22 ENCOUNTER — Encounter: Payer: Self-pay | Admitting: Ophthalmology

## 2018-03-29 DIAGNOSIS — H2511 Age-related nuclear cataract, right eye: Secondary | ICD-10-CM | POA: Diagnosis not present

## 2018-04-05 NOTE — Discharge Instructions (Signed)

## 2018-04-09 ENCOUNTER — Encounter: Payer: Self-pay | Admitting: Urology

## 2018-04-09 ENCOUNTER — Ambulatory Visit (INDEPENDENT_AMBULATORY_CARE_PROVIDER_SITE_OTHER): Payer: Medicare Other | Admitting: Urology

## 2018-04-09 VITALS — BP 104/66 | HR 84 | Wt 173.0 lb

## 2018-04-09 DIAGNOSIS — N952 Postmenopausal atrophic vaginitis: Secondary | ICD-10-CM

## 2018-04-09 DIAGNOSIS — N3020 Other chronic cystitis without hematuria: Secondary | ICD-10-CM | POA: Diagnosis not present

## 2018-04-09 MED ORDER — TRIMETHOPRIM 100 MG PO TABS: 100.0000 mg | ORAL_TABLET | Freq: Every day | ORAL | 11 refills | Status: DC

## 2018-04-09 MED ORDER — ESTROGENS, CONJUGATED 0.625 MG/GM VA CREA: 1.0000 | TOPICAL_CREAM | Freq: Every day | VAGINAL | 12 refills | Status: DC

## 2018-04-09 NOTE — Progress Notes (Signed)
04/09/2018 9:57 AM   Cassidy Bell 01-06-1941 [id number]  Referring provider: Merrilee Seashore, [place], [place] [address]  Chief Complaint  Patient presents with  . Follow-up    HPI: I dictated along the last time. She has been taking daily trimethoprim. She has prolapse noted. Frequency was improving and she never took the beta 3 agonists.  last Frequency is stable. Clinically no infection here prolapse stable. She still does not want surgery at this stage  The patient may have prolapse surgery in the future but is somewhat reluctant. Premarin prescription and trimethoprim renewed and I will see her in 1 year  Today No infections on daily trimethoprim.  Frequency stable.  Still utilizes estrogen cream   PMH: Past Medical History:  Diagnosis Date  . Arthritis   . Arthritis   . Atrophic vaginitis   . Bladder prolapse, female, acquired   . Hypertension   . Hypothyroidism   . Neuromuscular disorder ([place])   . 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 Urinary retention   . Wears dentures    full upper, partial lower    Surgical History: Past Surgical History:  Procedure Laterality Date  . ABDOMINAL HYSTERECTOMY    . bladder tach  1993  . CARPAL TUNNEL RELEASE    . CATARACT EXTRACTION W/PHACO Left 03/21/2018   Procedure: CATARACT EXTRACTION PHACO AND INTRAOCULAR LENS PLACEMENT ([place])  LEFT TORIC LENS;  Surgeon: Leandrew Koyanagi, MD;  Location: [facility];  Service: Ophthalmology;  Laterality: Left;  . COLONOSCOPY    . MUSCLE BIOPSY    . REPLACEMENT TOTAL HIP W/  RESURFACING IMPLANTS  1996   Right & Left     Home Medications:  Allergies as of 04/09/2018      Reactions   Bactrim [sulfamethoxazole-trimethoprim] Nausea And Vomiting   Pollen Extract Other (See Comments)   respiratory   Sulfa Antibiotics Rash   Other reaction(s): Other (See Comments) respiratory And GI Upset   Sulfacetamide  Sodium Rash   And GI Upset   Sulfamethoxazole-trimethoprim Rash      Medication List        Accurate as of 04/09/18  9:57 AM. Always use your most recent med list.          acetaminophen 500 MG tablet Commonly known as:  TYLENOL Take 500 mg by mouth.   ARTIFICIAL TEARS OP Apply to eye as needed.   aspirin 81 MG tablet Take 81 mg by mouth daily.   benazepril 10 MG tablet Commonly known as:  LOTENSIN Take 10 mg by mouth daily.   CALCIUM 600 + D PO Take by mouth. Reported on 08/25/2015   cetirizine 10 MG tablet Commonly known as:  ZYRTEC Take 10 mg by mouth daily.   CINNAMON PO Take by mouth daily.   conjugated estrogens vaginal cream Commonly known as:  PREMARIN Place 1 Applicatorful vaginally daily. Apply 0.5mg  (pea-sized amount)  just inside the vaginal introitus with a finger-tip every night for two weeks and then Monday, Wednesday and Friday nights.   fluticasone 50 MCG/ACT nasal spray Commonly known as:  FLONASE Place 2 sprays into the nose daily.   levothyroxine 75 MCG tablet Commonly known as:  SYNTHROID, LEVOTHROID   multivitamin tablet Take 1 tablet by mouth daily.   PROBIOTIC DAILY PO Take by mouth daily.   trimethoprim 100 MG tablet Commonly known as:  TRIMPEX Take 1 tablet (100 mg total) by mouth daily.  Vitamin D 2000 units Caps Take by mouth.       Allergies:  Allergies  Allergen Reactions  . Bactrim [Sulfamethoxazole-Trimethoprim] Nausea And Vomiting  . Pollen Extract Other (See Comments)    respiratory  . Sulfa Antibiotics Rash    Other reaction(s): Other (See Comments) respiratory And GI Upset  . Sulfacetamide Sodium Rash    And GI Upset  . Sulfamethoxazole-Trimethoprim Rash    Family History: Family History  Problem Relation Age of Onset  . Cancer Father        colon  . Kidney cancer Neg Hx   . Renal cancer Neg Hx   . Bladder Cancer Neg Hx   . Kidney disease Neg Hx   . Breast cancer Neg Hx     Social History:   reports that she has never smoked. She has never used smokeless tobacco. She reports that she does not drink alcohol or use drugs.  ROS: UROLOGY Frequent Urination?: No Hard to postpone urination?: No Burning/pain with urination?: No Get up at night to urinate?: No Leakage of urine?: No Urine stream starts and stops?: No Trouble starting stream?: No Do you have to strain to urinate?: No Blood in urine?: No Urinary tract infection?: No Sexually transmitted disease?: No Injury to kidneys or bladder?: No Painful intercourse?: No Weak stream?: No Currently pregnant?: No Vaginal bleeding?: No Last menstrual period?: n  Gastrointestinal Nausea?: No Vomiting?: No Indigestion/heartburn?: No Diarrhea?: No Constipation?: No  Constitutional Fever: No Night sweats?: No Weight loss?: No Fatigue?: No  Skin Skin rash/lesions?: No Itching?: No  Eyes Blurred vision?: No Double vision?: No  Ears/Nose/Throat Sore throat?: No Sinus problems?: No  Hematologic/Lymphatic Swollen glands?: No Easy bruising?: No  Cardiovascular Leg swelling?: No Chest pain?: No  Respiratory Cough?: No Shortness of breath?: No  Endocrine Excessive thirst?: No  Musculoskeletal Back pain?: No Joint pain?: No  Neurological Headaches?: No Dizziness?: No  Psychologic Depression?: No Anxiety?: No  Physical Exam: BP 104/66 (BP Location: Left Arm)   Pulse 84   Wt 173 lb (78.5 kg)   BMI 31.64 kg/m   Constitutional:  Alert and oriented, No acute distress.  Laboratory Data: Lab Results  Component Value Date   WBC 7.9 06/30/2014   HGB 10.5 (L) 07/01/2014   HCT 33.5 (L) 06/30/2014   MCV 89 06/30/2014   PLT 174 06/30/2014    Lab Results  Component Value Date   CREATININE 0.57 (L) 06/30/2014    No results found for: PSA  No results found for: TESTOSTERONE  No results found for: HGBA1C  Urinalysis    Component Value Date/Time   COLORURINE Yellow 06/28/2014 1216    APPEARANCEUR Cloudy (A) 03/06/2017 1051   LABSPEC 1.004 06/28/2014 1216   PHURINE 7.0 06/28/2014 1216   GLUCOSEU Negative 03/06/2017 1051   GLUCOSEU Negative 06/28/2014 1216   HGBUR Negative 06/28/2014 1216   BILIRUBINUR Negative 03/06/2017 1051   BILIRUBINUR Negative 06/28/2014 1216   KETONESUR Negative 06/28/2014 1216   PROTEINUR Negative 03/06/2017 1051   PROTEINUR Negative 06/28/2014 1216   NITRITE Negative 03/06/2017 1051   NITRITE Positive 06/28/2014 1216   LEUKOCYTESUR 1+ (A) 03/06/2017 1051   LEUKOCYTESUR 3+ 06/28/2014 1216    Pertinent Imaging:   Assessment & Plan: Both prescriptions renewed and I will see her in 1 year  There are no diagnoses linked to this encounter.  Return in about 1 year (around 04/10/2019).  Reece Packer, MD  [place] [address], [place], [place]  [address] [phone]  

## 2018-04-11 ENCOUNTER — Ambulatory Visit
Admission: RE | Admit: 2018-04-11 | Discharge: 2018-04-11 | Disposition: A | Discharge status: Home / Self Care | Payer: Medicare Other | Source: Ambulatory Visit | Attending: Ophthalmology | Admitting: Ophthalmology

## 2018-04-11 ENCOUNTER — Encounter
Admission: RE | Disposition: A | Discharge status: Home / Self Care | Payer: Self-pay | Source: Ambulatory Visit | Attending: Ophthalmology

## 2018-04-11 ENCOUNTER — Ambulatory Visit: Payer: Medicare Other | Admitting: Anesthesiology

## 2018-04-11 DIAGNOSIS — K219 Gastro-esophageal reflux disease without esophagitis: Secondary | ICD-10-CM | POA: Insufficient documentation

## 2018-04-11 DIAGNOSIS — Z79899 Other long term (current) drug therapy: Secondary | ICD-10-CM | POA: Insufficient documentation

## 2018-04-11 DIAGNOSIS — H25811 Combined forms of age-related cataract, right eye: Secondary | ICD-10-CM | POA: Diagnosis not present

## 2018-04-11 DIAGNOSIS — Z7982 Long term (current) use of aspirin: Secondary | ICD-10-CM | POA: Insufficient documentation

## 2018-04-11 DIAGNOSIS — H2511 Age-related nuclear cataract, right eye: Secondary | ICD-10-CM | POA: Insufficient documentation

## 2018-04-11 DIAGNOSIS — I10 Essential (primary) hypertension: Secondary | ICD-10-CM | POA: Diagnosis not present

## 2018-04-11 DIAGNOSIS — E039 Hypothyroidism, unspecified: Secondary | ICD-10-CM | POA: Diagnosis not present

## 2018-04-11 HISTORY — PX: CATARACT EXTRACTION W/PHACO: SHX586

## 2018-04-11 SURGERY — PHACOEMULSIFICATION, CATARACT, WITH IOL INSERTION
Anesthesia: Monitor Anesthesia Care | Laterality: Right

## 2018-04-11 MED ORDER — BRIMONIDINE TARTRATE-TIMOLOL 0.2-0.5 % OP SOLN
OPHTHALMIC | Status: DC | PRN
  Administered 2018-04-11: 1 [drp] via OPHTHALMIC

## 2018-04-11 MED ORDER — MIDAZOLAM HCL 2 MG/2ML IJ SOLN
INTRAMUSCULAR | Status: DC | PRN
  Administered 2018-04-11: 1 mg via INTRAVENOUS
  Administered 2018-04-11: 0.5 mg via INTRAVENOUS

## 2018-04-11 MED ORDER — ARMC OPHTHALMIC DILATING DROPS
1.0000 "application " | OPHTHALMIC | Status: DC | PRN
  Administered 2018-04-11 (×3): 1 via OPHTHALMIC

## 2018-04-11 MED ORDER — MOXIFLOXACIN HCL 0.5 % OP SOLN
1.0000 [drp] | OPHTHALMIC | Status: DC | PRN
  Administered 2018-04-11 (×3): 1 [drp] via OPHTHALMIC

## 2018-04-11 MED ORDER — LACTATED RINGERS IV SOLN: 10.0000 mL/h | INTRAVENOUS | Status: DC

## 2018-04-11 MED ORDER — NA HYALUR & NA CHOND-NA HYALUR 0.4-0.35 ML IO KIT
PACK | INTRAOCULAR | Status: DC | PRN
  Administered 2018-04-11: 1 mL via INTRAOCULAR

## 2018-04-11 MED ORDER — LIDOCAINE HCL (PF) 2 % IJ SOLN
INTRAOCULAR | Status: DC | PRN
  Administered 2018-04-11: 1 mL via INTRAMUSCULAR

## 2018-04-11 MED ORDER — EPINEPHRINE PF 1 MG/ML IJ SOLN
INTRAOCULAR | Status: DC | PRN
  Administered 2018-04-11: 48 mL via OPHTHALMIC

## 2018-04-11 MED ORDER — FENTANYL CITRATE (PF) 100 MCG/2ML IJ SOLN
INTRAMUSCULAR | Status: DC | PRN
  Administered 2018-04-11: 50 ug via INTRAVENOUS

## 2018-04-11 MED ORDER — ONDANSETRON HCL 4 MG/2ML IJ SOLN: 4.0000 mg | Freq: Once | INTRAMUSCULAR | Status: DC | PRN

## 2018-04-11 MED ORDER — CEFUROXIME OPHTHALMIC INJECTION 1 MG/0.1 ML
INJECTION | OPHTHALMIC | Status: DC | PRN
  Administered 2018-04-11: 0.1 mL via INTRACAMERAL

## 2018-04-11 SURGICAL SUPPLY — 27 items
Acrysof IQ toric lens (Intraocular Lens) ×2 IMPLANT
CANNULA ANT/CHMB 27G (MISCELLANEOUS) ×1 IMPLANT
CANNULA ANT/CHMB 27GA (MISCELLANEOUS) ×3 IMPLANT
CARTRIDGE ABBOTT (MISCELLANEOUS) IMPLANT
GLOVE SURG LX 7.5 STRW (GLOVE) ×2
GLOVE SURG LX STRL 7.5 STRW (GLOVE) ×1 IMPLANT
GLOVE SURG TRIUMPH 8.0 PF LTX (GLOVE) ×3 IMPLANT
GOWN STRL REUS W/ TWL LRG LVL3 (GOWN DISPOSABLE) ×2 IMPLANT
GOWN STRL REUS W/TWL LRG LVL3 (GOWN DISPOSABLE) ×6
MARKER SKIN DUAL TIP RULER LAB (MISCELLANEOUS) ×3 IMPLANT
NDL FILTER BLUNT 18X1 1/2 (NEEDLE) ×1 IMPLANT
NDL RETROBULBAR .5 NSTRL (NEEDLE) IMPLANT
NEEDLE FILTER BLUNT 18X 1/2SAF (NEEDLE) ×2
NEEDLE FILTER BLUNT 18X1 1/2 (NEEDLE) ×1 IMPLANT
PACK CATARACT BRASINGTON (MISCELLANEOUS) ×3 IMPLANT
PACK EYE AFTER SURG (MISCELLANEOUS) ×3 IMPLANT
PACK OPTHALMIC (MISCELLANEOUS) ×3 IMPLANT
RING MALYGIN 7.0 (MISCELLANEOUS) IMPLANT
SUT ETHILON 10-0 CS-B-6CS-B-6 (SUTURE)
SUT VICRYL  9 0 (SUTURE)
SUT VICRYL 9 0 (SUTURE) IMPLANT
SUTURE EHLN 10-0 CS-B-6CS-B-6 (SUTURE) IMPLANT
SYR 3ML LL SCALE MARK (SYRINGE) ×3 IMPLANT
SYR 5ML LL (SYRINGE) ×3 IMPLANT
SYR TB 1ML LUER SLIP (SYRINGE) ×3 IMPLANT
WATER STERILE IRR 500ML POUR (IV SOLUTION) ×3 IMPLANT
WIPE NON LINTING 3.25X3.25 (MISCELLANEOUS) ×3 IMPLANT

## 2018-04-11 NOTE — Op Note (Signed)
LOCATION:  [facility]   PREOPERATIVE DIAGNOSIS:  Nuclear sclerotic cataract of the right eye.  H25.11   POSTOPERATIVE DIAGNOSIS:  Nuclear sclerotic cataract of the right eye.   PROCEDURE:  Phacoemulsification with Toric posterior chamber intraocular lens placement of the right eye.   LENS:   Implant Name Type Inv. Item Serial No. Manufacturer Lot No. LRB No. Used  Acrysof IQ toric lens Intraocular Lens  [id number] ALCON  Right 1     [id number] 23.5 D Toric intraocular lens with 2.25 diopters of cylindrical power with axis orientation at 88 degrees.   ULTRASOUND TIME: 11 % of 0 minutes, 38 seconds.  CDE 4.0   SURGEON:  Wyonia Hough, MD   ANESTHESIA: Topical with tetracaine drops and 2% Xylocaine jelly, augmented with 1% preservative-free intracameral lidocaine. .   COMPLICATIONS:  None.   DESCRIPTION OF PROCEDURE:  The patient was identified in the holding room and transported to the operating suite and placed in the supine position under the operating microscope.  The right eye was identified as the operative eye, and it was prepped and draped in the usual sterile ophthalmic fashion.    A clear-corneal paracentesis incision was made at the 12:00 position.  0.5 ml of preservative-free 1% lidocaine was injected into the anterior chamber. The anterior chamber was filled with Viscoat.  A 2.4 millimeter near clear corneal incision was then made at the 9:00 position.  A cystotome and capsulorrhexis forceps were then used to make a curvilinear capsulorrhexis.  Hydrodissection and hydrodelineation were then performed using balanced salt solution.   Phacoemulsification was then used in stop and chop fashion to remove the lens, nucleus and epinucleus.  The remaining cortex was aspirated using the irrigation and aspiration handpiece.  Provisc viscoelastic was then placed into the capsular bag to distend it for lens placement.  The Verion digital marker was used to align the implant  at the intended axis.   A Toric lens was then injected into the capsular bag.  It was rotated clockwise until the axis marks on the lens were approximately 15 degrees in the counterclockwise direction to the intended alignment.  The viscoelastic was aspirated from the eye using the irrigation aspiration handpiece.  Then, a Koch spatula through the sideport incision was used to rotate the lens in a clockwise direction until the axis markings of the intraocular lens were lined up with the Verion alignment.  Balanced salt solution was then used to hydrate the wounds. Cefuroxime 0.1 ml of a 10mg /ml solution was injected into the anterior chamber for a dose of 1 mg of intracameral antibiotic at the completion of the case.    The eye was noted to have a physiologic pressure and there was no wound leak noted.   Timolol and Brimonidine drops were applied to the eye.  The patient was taken to the recovery room in stable condition having had no complications of anesthesia or surgery.  Devaughn Savant 04/11/2018, 2:20 PM

## 2018-04-11 NOTE — Anesthesia Procedure Notes (Signed)
Procedure Name: MAC Performed by: Moni Rothrock, CRNA Pre-anesthesia Checklist: Patient identified, Emergency Drugs available, Suction available, Timeout performed and Patient being monitored Patient Re-evaluated:Patient Re-evaluated prior to induction Oxygen Delivery Method: Nasal cannula Placement Confirmation: positive ETCO2       

## 2018-04-11 NOTE — Anesthesia Postprocedure Evaluation (Signed)
Anesthesia Post Note  Patient: Cassidy Bell  Procedure(s) Performed: CATARACT EXTRACTION PHACO AND INTRAOCULAR LENS PLACEMENT (IOC)  RIGHT TORIC LENS (Right )  Patient location during evaluation: PACU Anesthesia Type: MAC Level of consciousness: awake and alert and oriented Pain management: satisfactory to patient Vital Signs Assessment: post-procedure vital signs reviewed and stable Respiratory status: spontaneous breathing, nonlabored ventilation and respiratory function stable Cardiovascular status: blood pressure returned to baseline and stable Postop Assessment: Adequate PO intake and No signs of nausea or vomiting Anesthetic complications: no    Raliegh Ip

## 2018-04-11 NOTE — H&P (Signed)
The History and Physical notes are on paper, have been signed, and are to be scanned. The patient remains stable and unchanged from the H&P.   Previous H&P reviewed, patient examined, and there are no changes.  Antawn Sison 04/11/2018 12:59 PM

## 2018-04-11 NOTE — Anesthesia Preprocedure Evaluation (Signed)
Anesthesia Evaluation  Patient identified by MRN, date of birth, ID band Patient awake    Reviewed: Allergy & Precautions, H&P , NPO status , Patient's Chart, lab work & pertinent test results  Airway Mallampati: II  TM Distance: >3 FB Neck ROM: full    Dental  (+) Upper Dentures, Partial Lower   Pulmonary    Pulmonary exam normal breath sounds clear to auscultation       Cardiovascular hypertension, Normal cardiovascular exam Rhythm:regular Rate:Normal     Neuro/Psych  Neuromuscular disease (hx polymyositis)    GI/Hepatic   Endo/Other  Hypothyroidism BMI 31   Renal/GU      Musculoskeletal  (+) Arthritis ,   Abdominal   Peds  Hematology   Anesthesia Other Findings   Reproductive/Obstetrics                             Anesthesia Physical  Anesthesia Plan  ASA: II  Anesthesia Plan: MAC   Post-op Pain Management:    Induction: Intravenous  PONV Risk Score and Plan: 2 and Midazolam and Treatment may vary due to age or medical condition  Airway Management Planned: Nasal Cannula  Additional Equipment:   Intra-op Plan:   Post-operative Plan:   Informed Consent: I have reviewed the patients History and Physical, chart, labs and discussed the procedure including the risks, benefits and alternatives for the proposed anesthesia with the patient or authorized representative who has indicated his/her understanding and acceptance.     Plan Discussed with: CRNA  Anesthesia Plan Comments:         Anesthesia Quick Evaluation

## 2018-04-11 NOTE — Transfer of Care (Signed)
Immediate Anesthesia Transfer of Care Note  Patient: Cassidy Bell  Procedure(s) Performed: CATARACT EXTRACTION PHACO AND INTRAOCULAR LENS PLACEMENT (IOC)  RIGHT TORIC LENS (Right )  Patient Location: PACU  Anesthesia Type: MAC  Level of Consciousness: awake, alert  and patient cooperative  Airway and Oxygen Therapy: Patient Spontanous Breathing and Patient connected to supplemental oxygen  Post-op Assessment: Post-op Vital signs reviewed, Patient's Cardiovascular Status Stable, Respiratory Function Stable, Patent Airway and No signs of Nausea or vomiting  Post-op Vital Signs: Reviewed and stable  Complications: No apparent anesthesia complications

## 2018-04-12 ENCOUNTER — Encounter: Payer: Self-pay | Admitting: Ophthalmology

## 2018-05-02 DIAGNOSIS — Z23 Encounter for immunization: Secondary | ICD-10-CM | POA: Diagnosis not present

## 2018-05-16 DIAGNOSIS — E039 Hypothyroidism, unspecified: Secondary | ICD-10-CM | POA: Diagnosis not present

## 2018-05-16 DIAGNOSIS — E782 Mixed hyperlipidemia: Secondary | ICD-10-CM | POA: Diagnosis not present

## 2018-05-16 DIAGNOSIS — I10 Essential (primary) hypertension: Secondary | ICD-10-CM | POA: Diagnosis not present

## 2018-05-16 DIAGNOSIS — M3320 Polymyositis, organ involvement unspecified: Secondary | ICD-10-CM | POA: Diagnosis not present

## 2018-05-22 DIAGNOSIS — E782 Mixed hyperlipidemia: Secondary | ICD-10-CM | POA: Diagnosis not present

## 2018-05-22 DIAGNOSIS — I10 Essential (primary) hypertension: Secondary | ICD-10-CM | POA: Diagnosis not present

## 2018-05-22 DIAGNOSIS — M3320 Polymyositis, organ involvement unspecified: Secondary | ICD-10-CM | POA: Diagnosis not present

## 2018-05-22 DIAGNOSIS — E039 Hypothyroidism, unspecified: Secondary | ICD-10-CM | POA: Diagnosis not present

## 2018-07-30 DIAGNOSIS — H4311 Vitreous hemorrhage, right eye: Secondary | ICD-10-CM | POA: Diagnosis not present

## 2018-08-06 ENCOUNTER — Other Ambulatory Visit: Payer: Self-pay | Admitting: Internal Medicine

## 2018-08-06 DIAGNOSIS — Z1231 Encounter for screening mammogram for malignant neoplasm of breast: Secondary | ICD-10-CM

## 2018-08-21 ENCOUNTER — Other Ambulatory Visit: Payer: Self-pay

## 2018-08-21 MED ORDER — TRIMETHOPRIM 100 MG PO TABS: 100.0000 mg | ORAL_TABLET | Freq: Every day | ORAL | 11 refills | Status: DC

## 2018-08-21 NOTE — Telephone Encounter (Signed)
Received refill request for Trimethoprim from [facility]. Refilled Rx.

## 2018-08-28 DIAGNOSIS — H43812 Vitreous degeneration, left eye: Secondary | ICD-10-CM | POA: Diagnosis not present

## 2018-09-20 DIAGNOSIS — S90211A Contusion of right great toe with damage to nail, initial encounter: Secondary | ICD-10-CM | POA: Diagnosis not present

## 2018-09-24 ENCOUNTER — Ambulatory Visit: Payer: Self-pay | Admitting: Podiatry

## 2018-11-20 DIAGNOSIS — M3320 Polymyositis, organ involvement unspecified: Secondary | ICD-10-CM | POA: Diagnosis not present

## 2018-11-20 DIAGNOSIS — E039 Hypothyroidism, unspecified: Secondary | ICD-10-CM | POA: Diagnosis not present

## 2018-11-20 DIAGNOSIS — Z780 Asymptomatic menopausal state: Secondary | ICD-10-CM | POA: Diagnosis not present

## 2018-11-20 DIAGNOSIS — I10 Essential (primary) hypertension: Secondary | ICD-10-CM | POA: Diagnosis not present

## 2018-11-20 DIAGNOSIS — M8580 Other specified disorders of bone density and structure, unspecified site: Secondary | ICD-10-CM | POA: Diagnosis not present

## 2018-11-20 DIAGNOSIS — Z0000 Encounter for general adult medical examination without abnormal findings: Secondary | ICD-10-CM | POA: Diagnosis not present

## 2018-11-20 DIAGNOSIS — E782 Mixed hyperlipidemia: Secondary | ICD-10-CM | POA: Diagnosis not present

## 2018-11-27 DIAGNOSIS — Z7189 Other specified counseling: Secondary | ICD-10-CM | POA: Diagnosis not present

## 2018-11-27 DIAGNOSIS — E039 Hypothyroidism, unspecified: Secondary | ICD-10-CM | POA: Diagnosis not present

## 2018-11-27 DIAGNOSIS — E6609 Other obesity due to excess calories: Secondary | ICD-10-CM | POA: Diagnosis not present

## 2018-11-27 DIAGNOSIS — M3320 Polymyositis, organ involvement unspecified: Secondary | ICD-10-CM | POA: Diagnosis not present

## 2018-11-27 DIAGNOSIS — R7303 Prediabetes: Secondary | ICD-10-CM | POA: Diagnosis not present

## 2018-11-27 DIAGNOSIS — E782 Mixed hyperlipidemia: Secondary | ICD-10-CM | POA: Diagnosis not present

## 2018-11-27 DIAGNOSIS — I10 Essential (primary) hypertension: Secondary | ICD-10-CM | POA: Diagnosis not present

## 2019-02-12 ENCOUNTER — Ambulatory Visit
Admission: RE | Admit: 2019-02-12 | Discharge: 2019-02-12 | Disposition: A | Discharge status: Home / Self Care | Payer: Medicare Other | Source: Ambulatory Visit | Attending: Internal Medicine | Admitting: Internal Medicine

## 2019-02-12 ENCOUNTER — Other Ambulatory Visit: Payer: Self-pay

## 2019-02-12 DIAGNOSIS — Z1231 Encounter for screening mammogram for malignant neoplasm of breast: Secondary | ICD-10-CM | POA: Insufficient documentation

## 2019-02-13 ENCOUNTER — Other Ambulatory Visit: Payer: Self-pay | Admitting: Internal Medicine

## 2019-02-15 ENCOUNTER — Other Ambulatory Visit: Payer: Self-pay | Admitting: Internal Medicine

## 2019-02-15 DIAGNOSIS — R928 Other abnormal and inconclusive findings on diagnostic imaging of breast: Secondary | ICD-10-CM

## 2019-02-15 DIAGNOSIS — N6310 Unspecified lump in the right breast, unspecified quadrant: Secondary | ICD-10-CM

## 2019-02-18 DIAGNOSIS — M47898 Other spondylosis, sacral and sacrococcygeal region: Secondary | ICD-10-CM | POA: Diagnosis not present

## 2019-02-18 DIAGNOSIS — Z96643 Presence of artificial hip joint, bilateral: Secondary | ICD-10-CM | POA: Diagnosis not present

## 2019-02-18 DIAGNOSIS — Z471 Aftercare following joint replacement surgery: Secondary | ICD-10-CM | POA: Diagnosis not present

## 2019-02-18 DIAGNOSIS — Z96641 Presence of right artificial hip joint: Secondary | ICD-10-CM | POA: Diagnosis not present

## 2019-02-26 ENCOUNTER — Other Ambulatory Visit: Payer: Self-pay

## 2019-02-26 ENCOUNTER — Ambulatory Visit
Admission: RE | Admit: 2019-02-26 | Discharge: 2019-02-26 | Disposition: A | Discharge status: Home / Self Care | Payer: Medicare Other | Source: Ambulatory Visit | Attending: Internal Medicine | Admitting: Internal Medicine

## 2019-02-26 DIAGNOSIS — N6310 Unspecified lump in the right breast, unspecified quadrant: Secondary | ICD-10-CM | POA: Diagnosis not present

## 2019-02-26 DIAGNOSIS — N6312 Unspecified lump in the right breast, upper inner quadrant: Secondary | ICD-10-CM | POA: Diagnosis not present

## 2019-02-26 DIAGNOSIS — R928 Other abnormal and inconclusive findings on diagnostic imaging of breast: Secondary | ICD-10-CM | POA: Insufficient documentation

## 2019-03-07 DIAGNOSIS — H353131 Nonexudative age-related macular degeneration, bilateral, early dry stage: Secondary | ICD-10-CM | POA: Diagnosis not present

## 2019-03-26 DIAGNOSIS — R7303 Prediabetes: Secondary | ICD-10-CM | POA: Diagnosis not present

## 2019-03-26 DIAGNOSIS — E782 Mixed hyperlipidemia: Secondary | ICD-10-CM | POA: Diagnosis not present

## 2019-03-26 DIAGNOSIS — E039 Hypothyroidism, unspecified: Secondary | ICD-10-CM | POA: Diagnosis not present

## 2019-03-26 DIAGNOSIS — M3320 Polymyositis, organ involvement unspecified: Secondary | ICD-10-CM | POA: Diagnosis not present

## 2019-04-02 DIAGNOSIS — R7303 Prediabetes: Secondary | ICD-10-CM | POA: Diagnosis not present

## 2019-04-02 DIAGNOSIS — E039 Hypothyroidism, unspecified: Secondary | ICD-10-CM | POA: Diagnosis not present

## 2019-04-02 DIAGNOSIS — Z7189 Other specified counseling: Secondary | ICD-10-CM | POA: Diagnosis not present

## 2019-04-02 DIAGNOSIS — E782 Mixed hyperlipidemia: Secondary | ICD-10-CM | POA: Diagnosis not present

## 2019-04-02 DIAGNOSIS — I10 Essential (primary) hypertension: Secondary | ICD-10-CM | POA: Diagnosis not present

## 2019-04-02 DIAGNOSIS — Z23 Encounter for immunization: Secondary | ICD-10-CM | POA: Diagnosis not present

## 2019-04-15 ENCOUNTER — Encounter: Payer: Self-pay | Admitting: Urology

## 2019-04-15 ENCOUNTER — Other Ambulatory Visit: Payer: Self-pay

## 2019-04-15 ENCOUNTER — Ambulatory Visit (INDEPENDENT_AMBULATORY_CARE_PROVIDER_SITE_OTHER): Payer: Medicare Other | Admitting: Urology

## 2019-04-15 VITALS — BP 149/78 | HR 80 | Ht 62.0 in | Wt 177.0 lb

## 2019-04-15 DIAGNOSIS — N3020 Other chronic cystitis without hematuria: Secondary | ICD-10-CM | POA: Diagnosis not present

## 2019-04-15 LAB — URINALYSIS, COMPLETE
Bilirubin, UA: NEGATIVE
Glucose, UA: NEGATIVE
Ketones, UA: NEGATIVE
Nitrite, UA: POSITIVE — AB
Protein,UA: NEGATIVE
Specific Gravity, UA: 1.015 (ref 1.005–1.030)
Urobilinogen, Ur: 0.2 mg/dL (ref 0.2–1.0)
pH, UA: 6 (ref 5.0–7.5)

## 2019-04-15 LAB — MICROSCOPIC EXAMINATION
RBC: NONE SEEN /hpf (ref 0–2)
WBC, UA: >30 /hpf — AB (ref 0–5)

## 2019-04-15 MED ORDER — TRIMETHOPRIM 100 MG PO TABS: 100.0000 mg | ORAL_TABLET | Freq: Every day | ORAL | 11 refills | Status: DC

## 2019-04-15 MED ORDER — ESTRADIOL 0.1 MG/GM VA CREA: TOPICAL_CREAM | VAGINAL | 3 refills | Status: DC

## 2019-04-15 NOTE — Progress Notes (Signed)
04/15/2019 11:18 AM   Cassidy Bell 1941-06-07 [id number]  Referring provider: Merrilee Seashore, [place] [address],  [place] [address]  Chief Complaint  Patient presents with  . Follow-up    HPI: I dictated a long note April 2018.  She had mild mixed incontinence and mild prolapse symptoms but primarily urgency incontinence.  Trimethoprim was working well for 5 bladder infections and she had trouble to urinate on Vesicare and oxybutynin.  She understood treatment goals regarding prolapse surgery though it may have been aggravating a mild elevated residual urine volume.  Myrbetriq was tried   She has been taking daily trimethoprim. She has prolapse noted. Frequency was improving and she never took the beta 3 agonists.  Today In her last visit 2 years ago she was doing well on trimethoprim with watchful waiting for prolapse.  Takes vaginal estrogen 3 times a week prescribed by nurse practitioner.  Finds it too expensive at $100 and stopped it.  Has done very well on trimethoprim but in the last 2 weeks has foul-smelling urine.  Otherwise is done well.  Prolapse stable   PMH: Past Medical History:  Diagnosis Date  . Arthritis   . Arthritis   . Atrophic vaginitis   . Bladder prolapse, female, acquired   . Hypertension   . Hypothyroidism   . Neuromuscular disorder ([place])   . Polymyositis ([place])    in past. still has some weakness  . Urinary retention   . Wears dentures    full upper, partial lower    Surgical History: Past Surgical History:  Procedure Laterality Date  . ABDOMINAL HYSTERECTOMY    . bladder tach  1993  . CARPAL TUNNEL RELEASE    . CATARACT EXTRACTION W/PHACO Left 03/21/2018   Procedure: CATARACT EXTRACTION PHACO AND INTRAOCULAR LENS PLACEMENT ([place])  LEFT TORIC LENS;  Surgeon: Leandrew Koyanagi, MD;  Location: [facility];  Service: Ophthalmology;  Laterality: Left;  . CATARACT EXTRACTION W/PHACO Right 04/11/2018   Procedure: CATARACT EXTRACTION PHACO AND INTRAOCULAR LENS PLACEMENT ([place])  RIGHT TORIC LENS;  Surgeon: Leandrew Koyanagi, MD;  Location: [facility];  Service: Ophthalmology;  Laterality: Right;  . COLONOSCOPY    . MUSCLE BIOPSY    . REPLACEMENT TOTAL HIP W/  RESURFACING IMPLANTS  1996   Right & Left     Home Medications:  Allergies as of 04/15/2019      Reactions   Bactrim [sulfamethoxazole-trimethoprim] Nausea And Vomiting   Pollen Extract Other (See Comments)   respiratory   Sulfa Antibiotics Rash   Other reaction(s): Other (See Comments) respiratory And GI Upset   Sulfacetamide Sodium Rash   And GI Upset   Sulfamethoxazole-trimethoprim Rash      Medication List       Accurate as of April 15, 2019 11:18 AM. If you have any questions, ask your nurse or doctor.        STOP taking these medications   conjugated estrogens vaginal cream Commonly known as: Premarin Stopped by: Reece Packer, MD     TAKE these medications   acetaminophen 500 MG tablet Commonly known as: TYLENOL Take 500 mg by mouth.   ARTIFICIAL TEARS OP Apply to eye as needed.   aspirin 81 MG tablet Take 81 mg by mouth daily.   benazepril 10 MG tablet Commonly known as: LOTENSIN Take 10 mg by mouth daily.   CALCIUM 600 + D PO Take by mouth. Reported on 08/25/2015   cetirizine 10 MG tablet  Commonly known as: ZYRTEC Take 10 mg by mouth daily.   CINNAMON PO Take by mouth daily.   estradiol 0.1 MG/GM vaginal cream Commonly known as: ESTRACE 3 times a week one pea size amount Started by: Reece Packer, MD   fluticasone 50 MCG/ACT nasal spray Commonly known as: FLONASE Place 2 sprays into the nose daily.   levothyroxine 100 MCG tablet Commonly known as: SYNTHROID TK 1 T PO QD IN THE MORNING OES What changed: Another medication with the same name was removed. Continue taking this medication, and follow the directions you see here. Changed by: Reece Packer, MD    multivitamin tablet Take 1 tablet by mouth daily.   PROBIOTIC DAILY PO Take by mouth daily.   trimethoprim 100 MG tablet Commonly known as: TRIMPEX Take 1 tablet (100 mg total) by mouth daily.   Vitamin D 50 MCG (2000 UT) Caps Take by mouth.       Allergies:  Allergies  Allergen Reactions  . Bactrim [Sulfamethoxazole-Trimethoprim] Nausea And Vomiting  . Pollen Extract Other (See Comments)    respiratory  . Sulfa Antibiotics Rash    Other reaction(s): Other (See Comments) respiratory And GI Upset  . Sulfacetamide Sodium Rash    And GI Upset  . Sulfamethoxazole-Trimethoprim Rash    Family History: Family History  Problem Relation Age of Onset  . Cancer Father        colon  . Kidney cancer Neg Hx   . Renal cancer Neg Hx   . Bladder Cancer Neg Hx   . Kidney disease Neg Hx   . Breast cancer Neg Hx     Social History:  reports that she has never smoked. She has never used smokeless tobacco. She reports that she does not drink alcohol or use drugs.  ROS: UROLOGY Frequent Urination?: Yes Hard to postpone urination?: No Burning/pain with urination?: No Get up at night to urinate?: No Leakage of urine?: No Urine stream starts and stops?: No Trouble starting stream?: No Do you have to strain to urinate?: No Blood in urine?: No Urinary tract infection?: No Sexually transmitted disease?: No Injury to kidneys or bladder?: No Painful intercourse?: No Weak stream?: No Currently pregnant?: No Vaginal bleeding?: No Last menstrual period?: n  Gastrointestinal Nausea?: No Vomiting?: No Indigestion/heartburn?: No Diarrhea?: No Constipation?: No  Constitutional Fever: No Night sweats?: No Weight loss?: No Fatigue?: No  Skin Skin rash/lesions?: No Itching?: No  Eyes Blurred vision?: No Double vision?: No  Ears/Nose/Throat Sore throat?: No Sinus problems?: No  Hematologic/Lymphatic Swollen glands?: No Easy bruising?: No  Cardiovascular Leg  swelling?: No Chest pain?: No  Respiratory Cough?: No Shortness of breath?: No  Endocrine Excessive thirst?: No  Musculoskeletal Back pain?: No Joint pain?: No  Neurological Headaches?: No Dizziness?: No  Psychologic Depression?: No Anxiety?: No  Physical Exam: BP (!) 149/78   Pulse 80   Ht 5\' 2"  (1.575 m)   Wt 80.3 kg   BMI 32.37 kg/m   Constitutional:  Alert and oriented, No acute distress.   Laboratory Data: Lab Results  Component Value Date   WBC 7.9 06/30/2014   HGB 10.5 (L) 07/01/2014   HCT 33.5 (L) 06/30/2014   MCV 89 06/30/2014   PLT 174 06/30/2014    Lab Results  Component Value Date   CREATININE 0.57 (L) 06/30/2014    No results found for: PSA  No results found for: TESTOSTERONE  No results found for: HGBA1C  Urinalysis    Component Value  Date/Time   COLORURINE Yellow 06/28/2014 1216   APPEARANCEUR Cloudy (A) 03/06/2017 1051   LABSPEC 1.004 06/28/2014 1216   PHURINE 7.0 06/28/2014 1216   GLUCOSEU Negative 03/06/2017 1051   GLUCOSEU Negative 06/28/2014 1216   HGBUR Negative 06/28/2014 1216   BILIRUBINUR Negative 03/06/2017 1051   BILIRUBINUR Negative 06/28/2014 1216   KETONESUR Negative 06/28/2014 1216   PROTEINUR Negative 03/06/2017 1051   PROTEINUR Negative 06/28/2014 1216   NITRITE Negative 03/06/2017 1051   NITRITE Positive 06/28/2014 1216   LEUKOCYTESUR 1+ (A) 03/06/2017 1051   LEUKOCYTESUR 3+ 06/28/2014 1216    Pertinent Imaging:   Assessment & Plan: Patient trimethoprim renewed.  Culture sent and we will call regardless of results.  She was told that Estrace may be cheaper and this prescription was changed.  We talked about slowly titrating downward for convenience and reduce cost.  Reassess in 1 year  There are no diagnoses linked to this encounter.  Return in about 1 year (around 04/14/2020) for MD follow up.  Reece Packer, MD  [facility] [address], [place], [place]  [phone]

## 2019-04-18 LAB — CULTURE, URINE COMPREHENSIVE

## 2019-04-19 ENCOUNTER — Telehealth: Payer: Self-pay

## 2019-04-19 MED ORDER — NITROFURANTOIN MONOHYD MACRO 100 MG PO CAPS: 100.0000 mg | ORAL_CAPSULE | Freq: Two times a day (BID) | ORAL | 0 refills | Status: DC

## 2019-04-19 NOTE — Telephone Encounter (Signed)
Patient notified that culture was positive and abx script was sent to pharmacy and to stop daily trimethoprim while taking and restart after. Patient verbalized understanding

## 2019-04-19 NOTE — Telephone Encounter (Signed)
-----   Message from Bjorn Loser, MD sent at 04/19/2019 12:25 PM EDT -----  Macrodantin 100 mg twice a day for 1 week   Then go back on daily trimethoprim     ----- Message ----- From: Royanne Foots, CMA Sent: 04/18/2019   4:40 PM EDT To: Bjorn Loser, MD   ----- Message ----- From: Interface, [facility] Lab Results In Sent: 04/15/2019   4:37 PM EDT To: [facility]

## 2019-06-25 DIAGNOSIS — M47818 Spondylosis without myelopathy or radiculopathy, sacral and sacrococcygeal region: Secondary | ICD-10-CM | POA: Diagnosis not present

## 2019-06-25 DIAGNOSIS — M25552 Pain in left hip: Secondary | ICD-10-CM | POA: Diagnosis not present

## 2019-06-25 DIAGNOSIS — Z471 Aftercare following joint replacement surgery: Secondary | ICD-10-CM | POA: Diagnosis not present

## 2019-06-25 DIAGNOSIS — Z96642 Presence of left artificial hip joint: Secondary | ICD-10-CM | POA: Diagnosis not present

## 2019-06-25 DIAGNOSIS — Z96643 Presence of artificial hip joint, bilateral: Secondary | ICD-10-CM | POA: Diagnosis not present

## 2019-08-21 ENCOUNTER — Ambulatory Visit (INDEPENDENT_AMBULATORY_CARE_PROVIDER_SITE_OTHER): Payer: Medicare Other | Admitting: Urology

## 2019-08-21 ENCOUNTER — Encounter: Payer: Self-pay | Admitting: Urology

## 2019-08-21 ENCOUNTER — Other Ambulatory Visit: Payer: Self-pay

## 2019-08-21 ENCOUNTER — Telehealth: Payer: Self-pay

## 2019-08-21 VITALS — BP 126/75 | HR 78 | Ht 62.0 in | Wt 177.0 lb

## 2019-08-21 DIAGNOSIS — M545 Low back pain, unspecified: Secondary | ICD-10-CM

## 2019-08-21 DIAGNOSIS — N3020 Other chronic cystitis without hematuria: Secondary | ICD-10-CM | POA: Diagnosis not present

## 2019-08-21 LAB — BLADDER SCAN AMB NON-IMAGING: Scan Result: 36

## 2019-08-21 NOTE — Telephone Encounter (Signed)
Patient called office today complaining of low back pain for two days. She denies hematuria, frequency, urgency and dysuria. Patient was informed that with out uriniary symptoms it is more likely muscular skeletal rather then "kindey" causing her pain. Patient was encouraged to follow up with PCP. Patient states she would like to rule out UTI first, she was made an appointment today with PA for further evaluation

## 2019-08-21 NOTE — Progress Notes (Signed)
08/21/2019 4:12 PM   Cassidy Bell 09-03-1940 [id number]  Referring provider: Merrilee Seashore, [place] [address] [place],  [place] [address]  Chief Complaint  Patient presents with  . Recurrent UTI    HPI: Cassidy Bell is a 79 year old female with mixed incontinence, pelvic prolapse, vaginal atrophy and rUTI's followed by Dr. Matilde Sprang who presents today with the complaint of low back pain.    RUS in 2018 was normal.   Last seen by Dr. Matilde Sprang on 04/15/2019.  She was given a renewal on trimethoprim and vaginal estrogen cream and was to follow up in one year.    Today, she is experiencing worsening low back pain for two days.  Throbbing pain.  No radiation of pain.  7-8/10 pain.  Aleve has helped the pain.  Patient denies any aggravating factors.  Patient denies any gross hematuria, dysuria or suprapubic/flank pain.  Patient denies any fevers, chills, nausea or vomiting. CATH UA yellow clear, negative dip and 0-5 WBC's, 0-10 epithelial cells and hyaline casts.    PMH: Past Medical History:  Diagnosis Date  . Arthritis   . Arthritis   . Atrophic vaginitis   . Bladder prolapse, female, acquired   . Hypertension   . Hypothyroidism   . Neuromuscular disorder ([place])   . Polymyositis ([place])    in past. still has some weakness  . Urinary retention   . Wears dentures    full upper, partial lower    Surgical History: Past Surgical History:  Procedure Laterality Date  . ABDOMINAL HYSTERECTOMY    . bladder tach  1993  . CARPAL TUNNEL RELEASE    . CATARACT EXTRACTION W/PHACO Left 03/21/2018   Procedure: CATARACT EXTRACTION PHACO AND INTRAOCULAR LENS PLACEMENT ([place])  LEFT TORIC LENS;  Surgeon: Leandrew Koyanagi, MD;  Location: [facility];  Service: Ophthalmology;  Laterality: Left;  . CATARACT EXTRACTION W/PHACO Right 04/11/2018   Procedure: CATARACT EXTRACTION PHACO AND INTRAOCULAR LENS PLACEMENT ([facility])  RIGHT TORIC LENS;  Surgeon:  Leandrew Koyanagi, MD;  Location: [facility];  Service: Ophthalmology;  Laterality: Right;  . COLONOSCOPY    . MUSCLE BIOPSY    . REPLACEMENT TOTAL HIP W/  RESURFACING IMPLANTS  1996   Right & Left     Home Medications:  Allergies as of 08/21/2019      Reactions   Bactrim [sulfamethoxazole-trimethoprim] Nausea And Vomiting   Pollen Extract Other (See Comments)   respiratory   Sulfa Antibiotics Rash   Other reaction(s): Other (See Comments) respiratory And GI Upset   Sulfacetamide Sodium Rash   And GI Upset   Sulfamethoxazole-trimethoprim Rash      Medication List       Accurate as of August 21, 2019  4:12 PM. If you have any questions, ask your nurse or doctor.        acetaminophen 500 MG tablet Commonly known as: TYLENOL Take 500 mg by mouth.   ARTIFICIAL TEARS OP Apply to eye as needed.   aspirin 81 MG tablet Take 81 mg by mouth daily.   benazepril 10 MG tablet Commonly known as: LOTENSIN Take 10 mg by mouth daily.   CALCIUM 600 + D PO Take by mouth. Reported on 08/25/2015   cetirizine 10 MG tablet Commonly known as: ZYRTEC Take 10 mg by mouth daily.   CINNAMON PO Take by mouth daily.   estradiol 0.1 MG/GM vaginal cream Commonly known as: ESTRACE 3 times a week one pea size amount  fluticasone 50 MCG/ACT nasal spray Commonly known as: FLONASE Place 2 sprays into the nose daily.   levothyroxine 100 MCG tablet Commonly known as: SYNTHROID TK 1 T PO QD IN THE MORNING OES   multivitamin tablet Take 1 tablet by mouth daily.   nitrofurantoin (macrocrystal-monohydrate) 100 MG capsule Commonly known as: MACROBID Take 1 capsule (100 mg total) by mouth every 12 (twelve) hours.   PROBIOTIC DAILY PO Take by mouth daily.   trimethoprim 100 MG tablet Commonly known as: TRIMPEX Take 1 tablet (100 mg total) by mouth daily.   Vitamin D 50 MCG (2000 UT) Caps Take by mouth.       Allergies:  Allergies  Allergen Reactions  . Bactrim  [Sulfamethoxazole-Trimethoprim] Nausea And Vomiting  . Pollen Extract Other (See Comments)    respiratory  . Sulfa Antibiotics Rash    Other reaction(s): Other (See Comments) respiratory And GI Upset  . Sulfacetamide Sodium Rash    And GI Upset  . Sulfamethoxazole-Trimethoprim Rash    Family History: Family History  Problem Relation Age of Onset  . Cancer Father        colon  . Kidney cancer Neg Hx   . Renal cancer Neg Hx   . Bladder Cancer Neg Hx   . Kidney disease Neg Hx   . Breast cancer Neg Hx     Social History:  reports that she has never smoked. She has never used smokeless tobacco. She reports that she does not drink alcohol or use drugs.  ROS: UROLOGY Frequent Urination?: No Hard to postpone urination?: No Burning/pain with urination?: No Get up at night to urinate?: No Leakage of urine?: No Urine stream starts and stops?: No Trouble starting stream?: No Do you have to strain to urinate?: No Blood in urine?: No Urinary tract infection?: No Sexually transmitted disease?: No Injury to kidneys or bladder?: No Painful intercourse?: No Weak stream?: No Currently pregnant?: No Vaginal bleeding?: No Last menstrual period?: n  Gastrointestinal Nausea?: No Vomiting?: No Indigestion/heartburn?: No Diarrhea?: No Constipation?: No  Constitutional Fever: No Night sweats?: No Weight loss?: No Fatigue?: No  Skin Skin rash/lesions?: No Itching?: No  Eyes Blurred vision?: No Double vision?: No  Ears/Nose/Throat Sore throat?: No Sinus problems?: No  Hematologic/Lymphatic Swollen glands?: No Easy bruising?: No  Cardiovascular Leg swelling?: No Chest pain?: No  Respiratory Cough?: No Shortness of breath?: No  Endocrine Excessive thirst?: No  Musculoskeletal Back pain?: Yes Joint pain?: No  Neurological Headaches?: No Dizziness?: No  Psychologic Depression?: No Anxiety?: No  Physical Exam: BP 126/75   Pulse 78   Ht 5\' 2"  (1.575  m)   Wt 177 lb (80.3 kg)   BMI 32.37 kg/m   Constitutional:  Well nourished. Alert and oriented, No acute distress. HEENT: [place] AT, mask in place.  Trachea midline, no masses. Cardiovascular: No clubbing, cyanosis, or edema. Respiratory: Normal respiratory effort, no increased work of breathing. GI: Abdomen is soft, non tender, non distended, no abdominal masses.  GU: No CVA tenderness.  No bladder fullness or masses.   Skin: No rashes, bruises or suspicious lesions. Neurologic: Grossly intact, no focal deficits, moving all 4 extremities.  LBP aggravated when back is in extension.    Psychiatric: Normal mood and affect.  Laboratory Data: Lab Results  Component Value Date   WBC 7.9 06/30/2014   HGB 10.5 (L) 07/01/2014   HCT 33.5 (L) 06/30/2014   MCV 89 06/30/2014   PLT 174 06/30/2014    Lab Results  Component Value Date   CREATININE 0.57 (L) 06/30/2014    No results found for: PSA  No results found for: TESTOSTERONE  No results found for: HGBA1C  No results found for: TSH  No results found for: CHOL, HDL, CHOLHDL, VLDL, LDLCALC  Lab Results  Component Value Date   AST 40 (H) 06/28/2014   Lab Results  Component Value Date   ALT 43 06/28/2014   No components found for: ALKALINEPHOPHATASE No components found for: BILIRUBINTOTAL  No results found for: ESTRADIOL  Urinalysis Component     Latest Ref Rng & Units 08/21/2019  Specific Gravity, UA     1.005 - 1.030 1.020  pH, UA     5.0 - 7.5 6.0  Color, UA     Yellow Yellow  Appearance Ur     Clear Clear  Leukocytes,UA     Negative Negative  Protein,UA     Negative/Trace Negative  Glucose, UA     Negative Negative  Ketones, UA     Negative Negative  RBC, UA     Negative Negative  Bilirubin, UA     Negative Negative  Urobilinogen, Ur     0.2 - 1.0 mg/dL 0.2  Nitrite, UA     Negative Negative  Microscopic Examination      See below:   Component     Latest Ref Rng & Units 08/21/2019  WBC, UA     0 -  5 /hpf 0-5  RBC     0 - 2 /hpf None seen  Epithelial Cells (non renal)     0 - 10 /hpf 0-10  Casts     None seen /lpf Present (A)  Cast Type     N/A Hyaline casts  Bacteria, UA     None seen/Few None seen    I have reviewed the labs.   Assessment & Plan:    1. Low back pain - Urinalysis, Complete - negative - Bladder Scan (Post Void Residual) in office - minimal - Likely MSK - offered KUB, but patient declined - will contact PCP if LBP worsens  Return for keep follow up with Dr. Matilde Sprang .  These notes generated with voice recognition software. I apologize for typographical errors.  [name], PA-C  [facility] [address]  [place], [place] [address] [phone]

## 2019-08-21 NOTE — Progress Notes (Signed)
In and Out Catheterization  Patient is present today for a I & O catheterization due to chronic cystitis . Patient was cleaned and prepped in a sterile fashion with betadine . A 14FR cath was inserted no complications were noted , 68ml of urine return was noted, urine was yellow in color. A clean urine sample was collected for UA. Bladder was drained  And catheter was removed with out difficulty.    Preformed by: Elberta Leatherwood, CMA

## 2019-08-23 LAB — MICROSCOPIC EXAMINATION
Bacteria, UA: NONE SEEN
RBC: NONE SEEN /hpf (ref 0–2)

## 2019-08-23 LAB — URINALYSIS, COMPLETE
Bilirubin, UA: NEGATIVE
Glucose, UA: NEGATIVE
Ketones, UA: NEGATIVE
Leukocytes,UA: NEGATIVE
Nitrite, UA: NEGATIVE
Protein,UA: NEGATIVE
RBC, UA: NEGATIVE
Specific Gravity, UA: 1.02 (ref 1.005–1.030)
Urobilinogen, Ur: 0.2 mg/dL (ref 0.2–1.0)
pH, UA: 6 (ref 5.0–7.5)

## 2019-09-20 IMAGING — MG DIGITAL SCREENING BILATERAL MAMMOGRAM WITH TOMO AND CAD
8 series · 9 of 24 positions shown · non-contrast
Comparison: Previous exam(s).

CLINICAL DATA: Screening.

EXAM:
DIGITAL SCREENING BILATERAL MAMMOGRAM WITH TOMO AND CAD

[R MLO synth-2D]
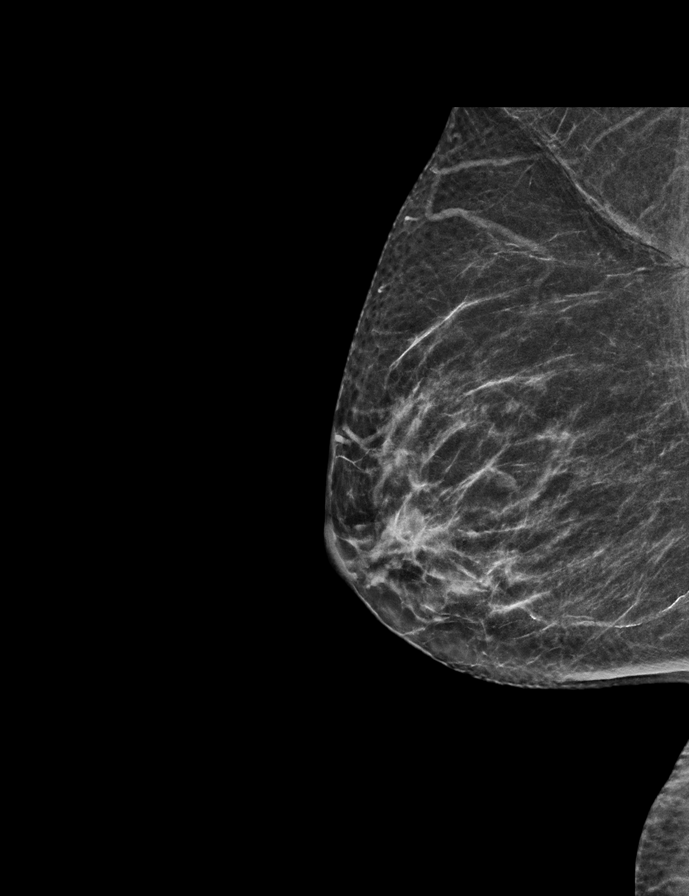

[L CC synth-2D]
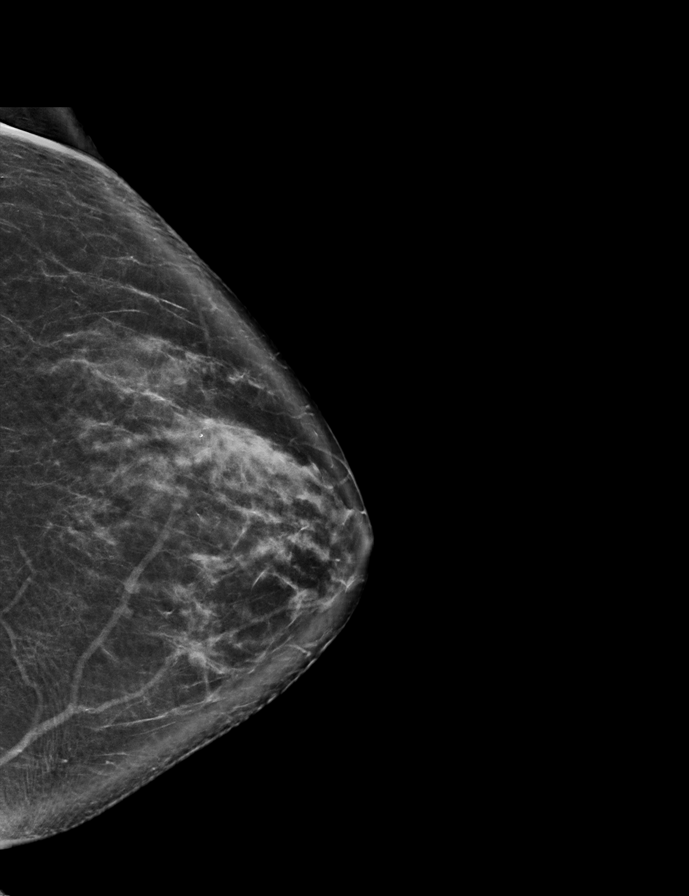

[L MLO synth-2D]
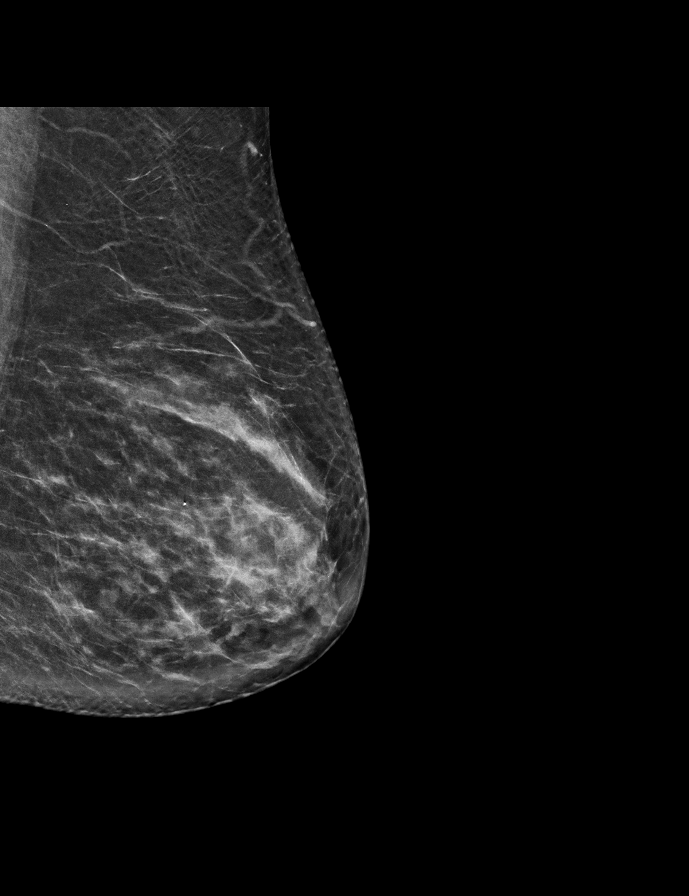

[R CC synth-2D]
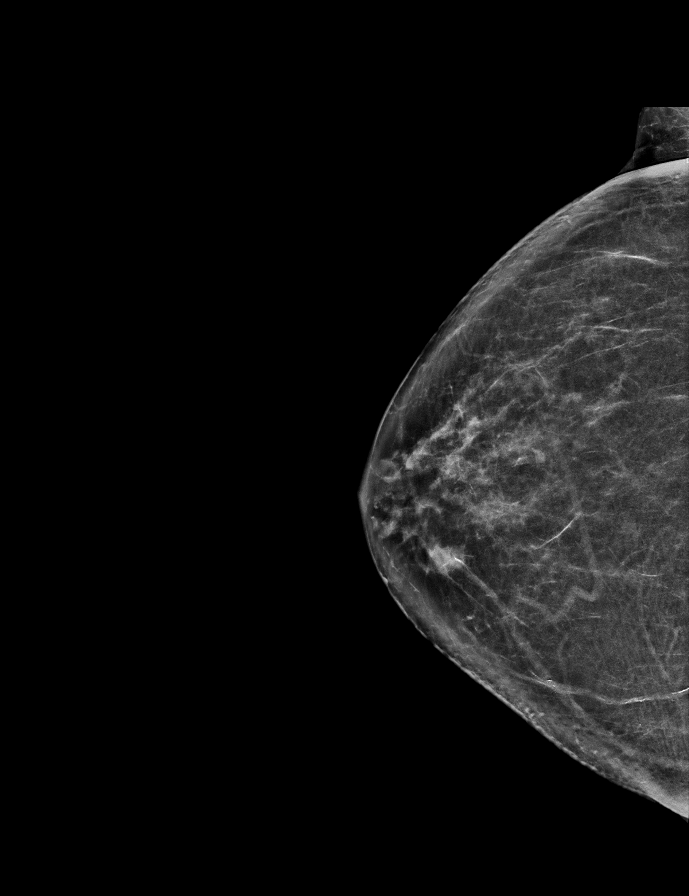

[L MLO tomo · 2 of 72 frames shown]
[frame 24/72]
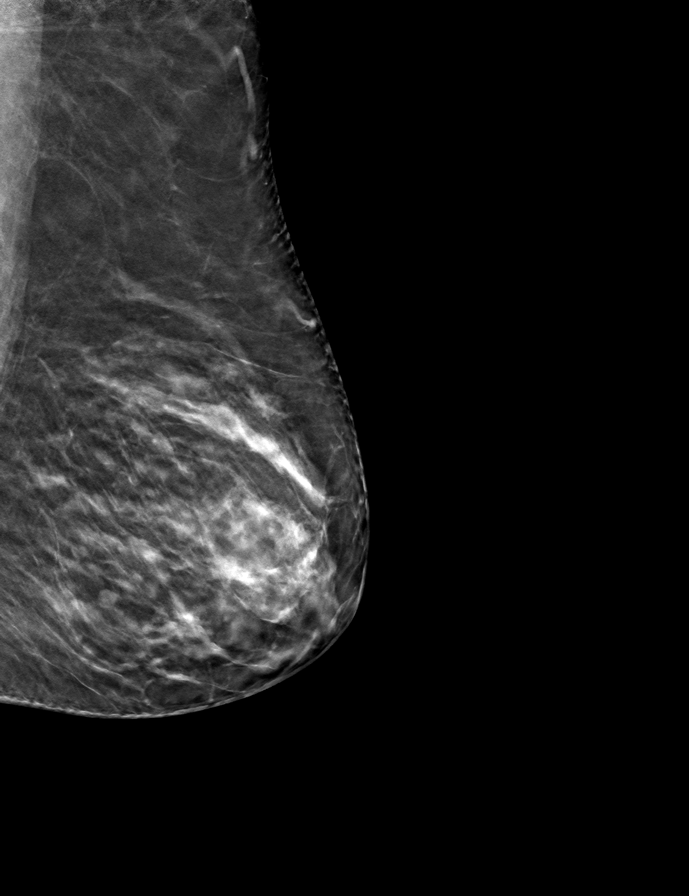
[frame 37/72]
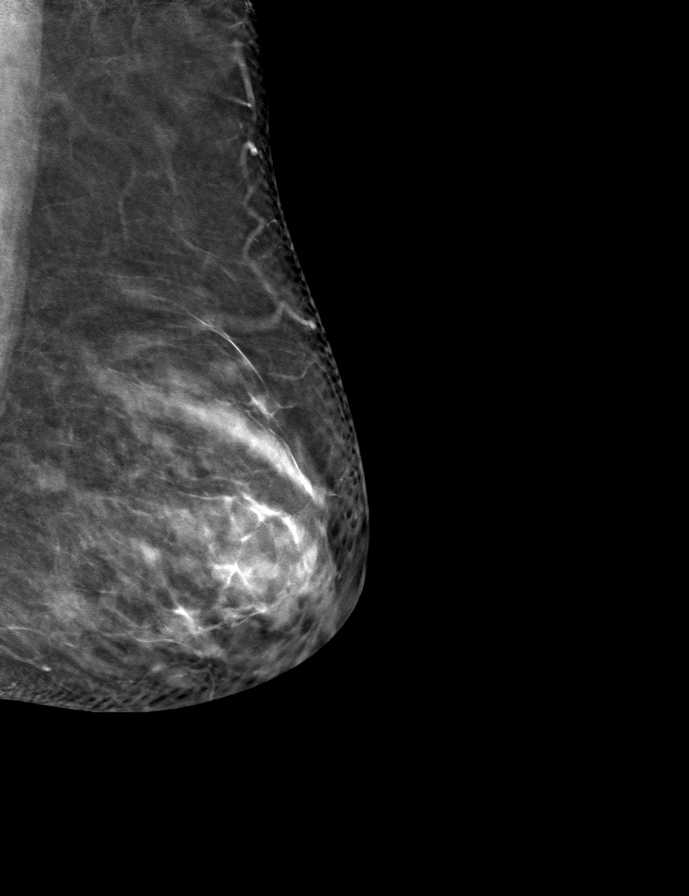

[R CC tomo · tomo slice 31/61.0]
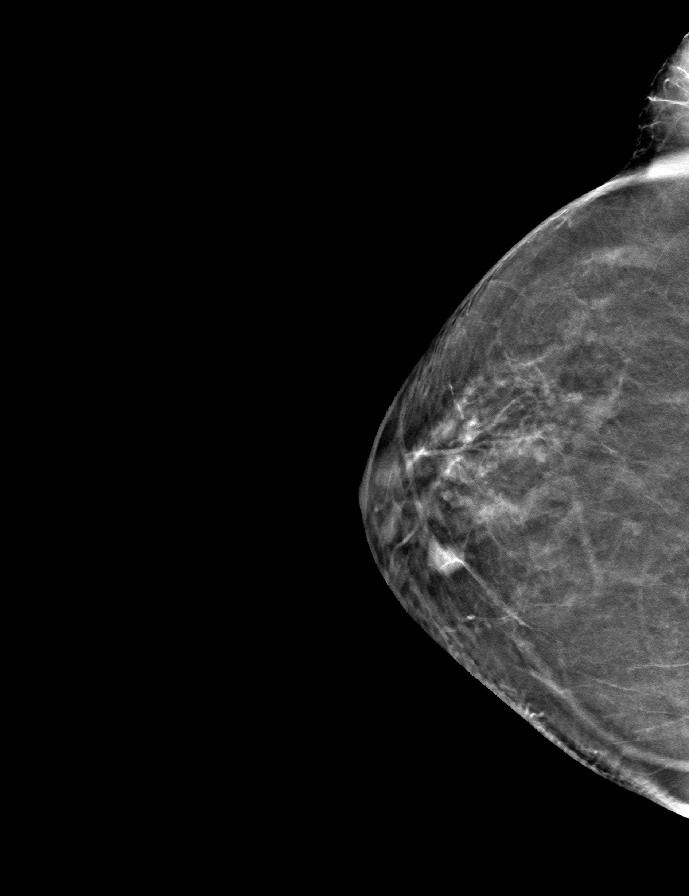

[L CC tomo · tomo slice 38/75.0]
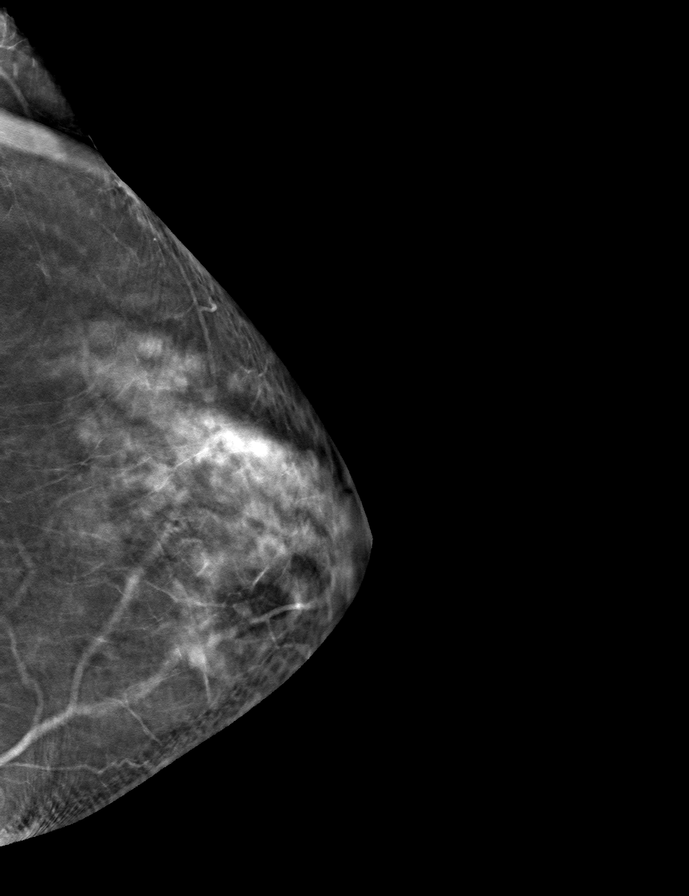

[R MLO tomo · tomo slice 27/53.0]
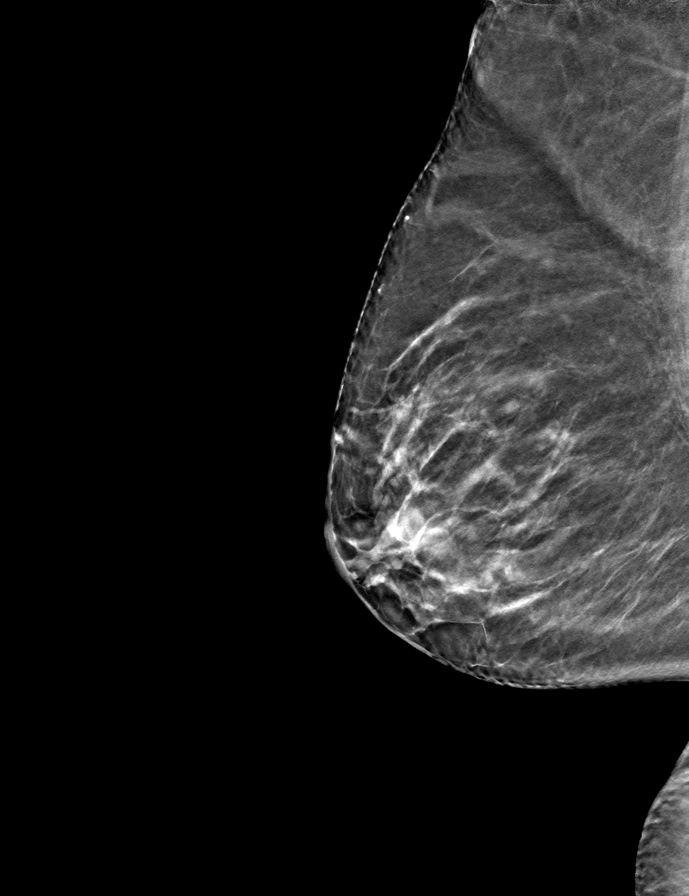

[9 of 24 positions shown; findings below may reference images not displayed]

ACR Breast Density Category b: There are scattered areas of
fibroglandular density.
FINDINGS: In the right breast, a possible mass warrants further evaluation. In
the left breast, no findings suspicious for malignancy. Images were
processed with CAD.
IMPRESSION: Further evaluation is suggested for possible mass in the right
breast.

RECOMMENDATION:
Diagnostic mammogram and possibly ultrasound of the right breast.
(Code:T1-A-550)

The patient will be contacted regarding the findings, and additional
imaging will be scheduled.

BI-RADS CATEGORY  0: Incomplete. Need additional imaging evaluation
and/or prior mammograms for comparison.

## 2019-10-29 DIAGNOSIS — R7303 Prediabetes: Secondary | ICD-10-CM | POA: Diagnosis not present

## 2019-10-29 DIAGNOSIS — I10 Essential (primary) hypertension: Secondary | ICD-10-CM | POA: Diagnosis not present

## 2019-10-29 DIAGNOSIS — E782 Mixed hyperlipidemia: Secondary | ICD-10-CM | POA: Diagnosis not present

## 2019-10-31 ENCOUNTER — Other Ambulatory Visit: Payer: Self-pay

## 2019-10-31 ENCOUNTER — Encounter (INDEPENDENT_AMBULATORY_CARE_PROVIDER_SITE_OTHER): Payer: Self-pay

## 2019-10-31 ENCOUNTER — Ambulatory Visit (INDEPENDENT_AMBULATORY_CARE_PROVIDER_SITE_OTHER): Payer: Medicare Other | Admitting: Physician Assistant

## 2019-10-31 ENCOUNTER — Telehealth: Payer: Self-pay | Admitting: *Deleted

## 2019-10-31 DIAGNOSIS — N390 Urinary tract infection, site not specified: Secondary | ICD-10-CM

## 2019-10-31 DIAGNOSIS — N3020 Other chronic cystitis without hematuria: Secondary | ICD-10-CM

## 2019-10-31 LAB — URINALYSIS, COMPLETE
Bilirubin, UA: NEGATIVE
Glucose, UA: NEGATIVE
Ketones, UA: NEGATIVE
Nitrite, UA: POSITIVE — AB
Protein,UA: NEGATIVE
RBC, UA: NEGATIVE
Specific Gravity, UA: 1.02 (ref 1.005–1.030)
Urobilinogen, Ur: 0.2 mg/dL (ref 0.2–1.0)
pH, UA: 7 (ref 5.0–7.5)

## 2019-10-31 LAB — MICROSCOPIC EXAMINATION
RBC, Urine: NONE SEEN /HPF (ref 0–2)
WBC, UA: >30 /HPF — AB (ref 0–5)

## 2019-10-31 MED ORDER — NITROFURANTOIN MONOHYD MACRO 100 MG PO CAPS: 100.0000 mg | ORAL_CAPSULE | Freq: Two times a day (BID) | ORAL | 0 refills | Status: AC

## 2019-10-31 NOTE — Progress Notes (Signed)
10/31/2019 6:29 PM   Cassidy Bell 05/30/1941 [id number]  CC: Malodorous urine  HPI: Cassidy Bell is a 79 y.o. female with PMH mixed incontinence, pelvic prolapse, vaginal atrophy, and recurrent UTI on daily trimethoprim suppression who presents today for evaluation of possible UTI. She is an established [facility] patient who last saw Cassidy Bell on 08/21/2019 for the same.  She reports a 2-day history of malodorous urine.  She has had a recent change in her diet.  No medication changes.  She denies dysuria, urgency, and frequency.  In-office catheterized UA today positive for nitrites and 1+ leukocyte esterase; urine microscopy with >30 WBCs/HPF and many bacteria.   She is allergic to Bactrim.  PMH: Past Medical History:  Diagnosis Date  . Arthritis   . Arthritis   . Atrophic vaginitis   . Bladder prolapse, female, acquired   . Hypertension   . Hypothyroidism   . Neuromuscular disorder ([place])   . Polymyositis ([place])    in past. still has some weakness  . Urinary retention   . Wears dentures    full upper, partial lower    Surgical History: Past Surgical History:  Procedure Laterality Date  . ABDOMINAL HYSTERECTOMY    . bladder tach  1993  . CARPAL TUNNEL RELEASE    . CATARACT EXTRACTION W/PHACO Left 03/21/2018   Procedure: CATARACT EXTRACTION PHACO AND INTRAOCULAR LENS PLACEMENT ([place])  LEFT TORIC LENS;  Surgeon: Leandrew Koyanagi, MD;  Location: [facility];  Service: Ophthalmology;  Laterality: Left;  . CATARACT EXTRACTION W/PHACO Right 04/11/2018   Procedure: CATARACT EXTRACTION PHACO AND INTRAOCULAR LENS PLACEMENT ([place])  RIGHT TORIC LENS;  Surgeon: Leandrew Koyanagi, MD;  Location: [facility];  Service: Ophthalmology;  Laterality: Right;  . COLONOSCOPY    . MUSCLE BIOPSY    . REPLACEMENT TOTAL HIP W/  RESURFACING IMPLANTS  1996   Right & Left     Home Medications:  Allergies as of 10/31/2019      Reactions   Bactrim  [sulfamethoxazole-trimethoprim] Nausea And Vomiting   Pollen Extract Other (See Comments)   respiratory   Sulfa Antibiotics Rash   Other reaction(s): Other (See Comments) respiratory And GI Upset   Sulfacetamide Sodium Rash   And GI Upset   Sulfamethoxazole-trimethoprim Rash      Medication List       Accurate as of October 31, 2019  6:29 PM. If you have any questions, ask your nurse or doctor.        acetaminophen 500 MG tablet Commonly known as: TYLENOL Take 500 mg by mouth.   amoxicillin 500 MG capsule Commonly known as: AMOXIL SMARTSIG:4 Capsule(s) By Mouth Once   ARTIFICIAL TEARS OP Apply to eye as needed.   aspirin 81 MG tablet Take 81 mg by mouth daily.   benazepril 10 MG tablet Commonly known as: LOTENSIN Take 10 mg by mouth daily.   CALCIUM 600 + D PO Take by mouth. Reported on 08/25/2015   cetirizine 10 MG tablet Commonly known as: ZYRTEC Take 10 mg by mouth daily.   CINNAMON PO Take by mouth daily.   estradiol 0.1 MG/GM vaginal cream Commonly known as: ESTRACE 3 times a week one pea size amount   fluticasone 50 MCG/ACT nasal spray Commonly known as: FLONASE Place 2 sprays into the nose daily.   levothyroxine 100 MCG tablet Commonly known as: SYNTHROID TK 1 T PO QD IN THE MORNING OES   multivitamin tablet Take 1 tablet by mouth daily.  nitrofurantoin (macrocrystal-monohydrate) 100 MG capsule Commonly known as: MACROBID Take 1 capsule (100 mg total) by mouth every 12 (twelve) hours for 7 days.   PROBIOTIC DAILY PO Take by mouth daily.   trimethoprim 100 MG tablet Commonly known as: TRIMPEX Take 1 tablet (100 mg total) by mouth daily.   Vitamin D 50 MCG (2000 UT) Caps Take by mouth.       Allergies:  Allergies  Allergen Reactions  . Bactrim [Sulfamethoxazole-Trimethoprim] Nausea And Vomiting  . Pollen Extract Other (See Comments)    respiratory  . Sulfa Antibiotics Rash    Other reaction(s): Other (See  Comments) respiratory And GI Upset  . Sulfacetamide Sodium Rash    And GI Upset  . Sulfamethoxazole-Trimethoprim Rash    Family History: Family History  Problem Relation Age of Onset  . Cancer Father        colon  . Kidney cancer Neg Hx   . Renal cancer Neg Hx   . Bladder Cancer Neg Hx   . Kidney disease Neg Hx   . Breast cancer Neg Hx     Social History:   reports that she has never smoked. She has never used smokeless tobacco. She reports that she does not drink alcohol or use drugs.  Physical Exam: There were no vitals taken for this visit.  Constitutional:  Alert and oriented, no acute distress, nontoxic appearing HEENT: [name], AT Cardiovascular: No clubbing, cyanosis, or edema Respiratory: Normal respiratory effort, no increased work of breathing Skin: No rashes, bruises or suspicious lesions Neurologic: Grossly intact, no focal deficits, moving all 4 extremities Psychiatric: Normal mood and affect  Laboratory Data: Results for orders placed or performed in visit on 10/31/19  Microscopic Examination   URINE  Result Value Ref Range   WBC, UA >30 (A) 0 - 5 /hpf   RBC None seen 0 - 2 /hpf   Epithelial Cells (non renal) 0-10 0 - 10 /hpf   Renal Epithel, UA 0-10 (A) None seen /hpf   Bacteria, UA Many (A) None seen/Few  Urinalysis, Complete  Result Value Ref Range   Specific Gravity, UA 1.020 1.005 - 1.030   pH, UA 7.0 5.0 - 7.5   Color, UA Yellow Yellow   Appearance Ur Cloudy (A) Clear   Leukocytes,UA 1+ (A) Negative   Protein,UA Negative Negative/Trace   Glucose, UA Negative Negative   Ketones, UA Negative Negative   RBC, UA Negative Negative   Bilirubin, UA Negative Negative   Urobilinogen, Ur 0.2 0.2 - 1.0 mg/dL   Nitrite, UA Positive (A) Negative   Microscopic Examination See below:    Assessment & Plan:   1. Recurrent UTI 79 year old female with a history of recurrent UTI on daily trimethoprim suppression presents with a 2-day history of malodorous  urine.  Urine grossly infected today, will start her on empiric Macrobid for management and send for culture.  Counseled patient that I will contact her if her urine culture indicated a necessary change in therapy and to stop daily trimethoprim while she is treating her current infection.  She expressed understanding. - CULTURE, URINE COMPREHENSIVE - Urinalysis, Complete - nitrofurantoin, macrocrystal-monohydrate, (MACROBID) 100 MG capsule; Take 1 capsule (100 mg total) by mouth every 12 (twelve) hours for 7 days.  Dispense: 14 capsule; Refill: 0 - Microscopic Examination   Return if symptoms worsen or fail to improve.  Debroah Loop, PA-C  [facility] [address] [place], [place] [address] [phone]

## 2019-10-31 NOTE — Addendum Note (Signed)
Addended by: Verlene Mayer A on: 10/31/2019 10:04 AM   Modules accepted: Orders

## 2019-10-31 NOTE — Telephone Encounter (Addendum)
Patient called Triage line, she has been having UTI symptoms> frequency, burning, odor for several days. Denies fevers, body aches, or chills. Appointment made for cath ua per Debroah Loop, PA.

## 2019-10-31 NOTE — Patient Instructions (Signed)
Stop trimethoprim while you are taking nitrofurantoin for management of your current infection.  Restart trimethoprim after you finish nitrofurantoin.  I will call you if I need to change your antibiotics based on your urine culture results.

## 2019-11-04 ENCOUNTER — Other Ambulatory Visit: Payer: Self-pay | Admitting: Internal Medicine

## 2019-11-04 DIAGNOSIS — Z1231 Encounter for screening mammogram for malignant neoplasm of breast: Secondary | ICD-10-CM

## 2019-11-04 LAB — CULTURE, URINE COMPREHENSIVE

## 2019-11-05 DIAGNOSIS — E039 Hypothyroidism, unspecified: Secondary | ICD-10-CM | POA: Diagnosis not present

## 2019-11-05 DIAGNOSIS — M109 Gout, unspecified: Secondary | ICD-10-CM | POA: Diagnosis not present

## 2019-11-05 DIAGNOSIS — I10 Essential (primary) hypertension: Secondary | ICD-10-CM | POA: Diagnosis not present

## 2019-11-05 DIAGNOSIS — R7303 Prediabetes: Secondary | ICD-10-CM | POA: Diagnosis not present

## 2019-11-05 DIAGNOSIS — M3320 Polymyositis, organ involvement unspecified: Secondary | ICD-10-CM | POA: Diagnosis not present

## 2019-11-05 DIAGNOSIS — E782 Mixed hyperlipidemia: Secondary | ICD-10-CM | POA: Diagnosis not present

## 2019-11-27 ENCOUNTER — Ambulatory Visit (INDEPENDENT_AMBULATORY_CARE_PROVIDER_SITE_OTHER): Payer: Medicare Other | Admitting: Dermatology

## 2019-11-27 ENCOUNTER — Other Ambulatory Visit: Payer: Self-pay

## 2019-11-27 ENCOUNTER — Encounter: Payer: Self-pay | Admitting: Dermatology

## 2019-11-27 DIAGNOSIS — Z86018 Personal history of other benign neoplasm: Secondary | ICD-10-CM

## 2019-11-27 DIAGNOSIS — L821 Other seborrheic keratosis: Secondary | ICD-10-CM

## 2019-11-27 DIAGNOSIS — L719 Rosacea, unspecified: Secondary | ICD-10-CM

## 2019-11-27 DIAGNOSIS — D1800 Hemangioma unspecified site: Secondary | ICD-10-CM

## 2019-11-27 DIAGNOSIS — L814 Other melanin hyperpigmentation: Secondary | ICD-10-CM

## 2019-11-27 DIAGNOSIS — Z1283 Encounter for screening for malignant neoplasm of skin: Secondary | ICD-10-CM

## 2019-11-27 DIAGNOSIS — L820 Inflamed seborrheic keratosis: Secondary | ICD-10-CM | POA: Diagnosis not present

## 2019-11-27 DIAGNOSIS — L578 Other skin changes due to chronic exposure to nonionizing radiation: Secondary | ICD-10-CM

## 2019-11-27 NOTE — Progress Notes (Signed)
   Follow-Up Visit   Subjective  Cassidy Bell is a 79 y.o. female who presents for the following: Annual Exam (UBSE - History of dysplastic nevus of left inf deltoid ). For upper body skin examination for skin cancer screening and mole check.   The following portions of the chart were reviewed this encounter and updated as appropriate:  Tobacco  Allergies  Meds  Problems  Med Hx  Surg Hx  Fam Hx      Review of Systems:  No other skin or systemic complaints except as noted in HPI or Assessment and Plan.  Objective  Well appearing patient in no apparent distress; mood and affect are within normal limits.  All skin waist up examined.  Objective  Left inf deltoid: Scar with no evidence of recurrence.   Objective  Right buttock: Erythematous keratotic or waxy stuck-on papule or plaque.   Objective  Face: Dilated blood vessels.   Assessment & Plan    Skin cancer screening performed today.  Actinic Damage - diffuse scaly erythematous macules with underlying dyspigmentation - Recommend daily broad spectrum sunscreen SPF 30+ to sun-exposed areas, reapply every 2 hours as needed.  - Call for new or changing lesions.  Seborrheic Keratoses - Stuck-on, waxy, tan-brown papules and plaques  - Discussed benign etiology and prognosis. - Observe - Call for any changes  Hemangiomas - Red papules - Discussed benign nature - Observe - Call for any changes  Lentigines - Scattered tan macules - Discussed due to sun exposure - Benign, observe - Call for any changes   History of dysplastic nevus Left inf deltoid  Clear today. Observe.  Inflamed seborrheic keratosis Right buttock  Destruction of lesion - Right buttock Complexity: simple   Destruction method: cryotherapy   Informed consent: discussed and consent obtained   Timeout:  patient name, date of birth, surgical site, and procedure verified Lesion destroyed using liquid nitrogen: Yes   Region frozen  until ice ball extended beyond lesion: Yes   Outcome: patient tolerated procedure well with no complications   Post-procedure details: wound care instructions given    Rosacea Face No treatment today. Discussed laser treatment  Return in about 1 year (around 11/26/2020).   I, Ashok Cordia, CMA, am acting as scribe for Sarina Ser, MD    Documentation: I have reviewed the above documentation for accuracy and completeness, and I agree with the above.  Sarina Ser, MD

## 2019-11-27 NOTE — Patient Instructions (Signed)

## 2019-11-30 ENCOUNTER — Encounter: Payer: Self-pay | Admitting: Dermatology

## 2020-01-01 DIAGNOSIS — T8484XA Pain due to internal orthopedic prosthetic devices, implants and grafts, initial encounter: Secondary | ICD-10-CM | POA: Diagnosis not present

## 2020-01-01 DIAGNOSIS — T84028D Dislocation of other internal joint prosthesis, subsequent encounter: Secondary | ICD-10-CM | POA: Diagnosis not present

## 2020-01-01 DIAGNOSIS — Z96643 Presence of artificial hip joint, bilateral: Secondary | ICD-10-CM | POA: Diagnosis not present

## 2020-01-01 DIAGNOSIS — Z471 Aftercare following joint replacement surgery: Secondary | ICD-10-CM | POA: Diagnosis not present

## 2020-01-01 DIAGNOSIS — Z96649 Presence of unspecified artificial hip joint: Secondary | ICD-10-CM | POA: Diagnosis not present

## 2020-01-01 DIAGNOSIS — T8484XD Pain due to internal orthopedic prosthetic devices, implants and grafts, subsequent encounter: Secondary | ICD-10-CM | POA: Diagnosis not present

## 2020-01-01 DIAGNOSIS — Z882 Allergy status to sulfonamides status: Secondary | ICD-10-CM | POA: Diagnosis not present

## 2020-01-01 DIAGNOSIS — M461 Sacroiliitis, not elsewhere classified: Secondary | ICD-10-CM | POA: Diagnosis not present

## 2020-01-01 DIAGNOSIS — M160 Bilateral primary osteoarthritis of hip: Secondary | ICD-10-CM | POA: Diagnosis not present

## 2020-01-09 ENCOUNTER — Telehealth: Payer: Self-pay

## 2020-01-09 NOTE — Telephone Encounter (Signed)
Patient called, pharmacy told her Trimpex is on back order, her last dose will be on monday

## 2020-01-10 NOTE — Telephone Encounter (Signed)
Please switched to Macrodantin 100 mg 30 tablets and 11 refills

## 2020-01-13 MED ORDER — NITROFURANTOIN MACROCRYSTAL 100 MG PO CAPS
100.0000 mg | ORAL_CAPSULE | Freq: Every day | ORAL | 3 refills | Status: DC
Start: 2020-01-13 — End: 2021-05-10

## 2020-01-13 NOTE — Telephone Encounter (Signed)
Pt aware medication is changing. Patient would prefer 90 days at a time due to her getting hip surgery

## 2020-01-23 DIAGNOSIS — I4510 Unspecified right bundle-branch block: Secondary | ICD-10-CM | POA: Diagnosis not present

## 2020-02-13 ENCOUNTER — Ambulatory Visit
Admission: RE | Admit: 2020-02-13 | Discharge: 2020-02-13 | Disposition: A | Discharge status: Home / Self Care | Payer: Medicare Other | Source: Ambulatory Visit | Attending: Internal Medicine | Admitting: Internal Medicine

## 2020-02-13 DIAGNOSIS — Z1231 Encounter for screening mammogram for malignant neoplasm of breast: Secondary | ICD-10-CM | POA: Insufficient documentation

## 2020-02-18 DIAGNOSIS — T8484XA Pain due to internal orthopedic prosthetic devices, implants and grafts, initial encounter: Secondary | ICD-10-CM | POA: Diagnosis not present

## 2020-02-18 DIAGNOSIS — Z79899 Other long term (current) drug therapy: Secondary | ICD-10-CM | POA: Diagnosis not present

## 2020-02-18 DIAGNOSIS — Z96642 Presence of left artificial hip joint: Secondary | ICD-10-CM | POA: Diagnosis not present

## 2020-02-18 DIAGNOSIS — T84051A Periprosthetic osteolysis of internal prosthetic left hip joint, initial encounter: Secondary | ICD-10-CM | POA: Diagnosis not present

## 2020-02-18 DIAGNOSIS — Z471 Aftercare following joint replacement surgery: Secondary | ICD-10-CM | POA: Diagnosis not present

## 2020-02-18 DIAGNOSIS — Z87440 Personal history of urinary (tract) infections: Secondary | ICD-10-CM | POA: Diagnosis not present

## 2020-02-18 DIAGNOSIS — M89559 Osteolysis, unspecified thigh: Secondary | ICD-10-CM | POA: Diagnosis not present

## 2020-02-18 DIAGNOSIS — Z96643 Presence of artificial hip joint, bilateral: Secondary | ICD-10-CM | POA: Diagnosis not present

## 2020-02-18 DIAGNOSIS — J309 Allergic rhinitis, unspecified: Secondary | ICD-10-CM | POA: Diagnosis not present

## 2020-02-18 DIAGNOSIS — I10 Essential (primary) hypertension: Secondary | ICD-10-CM | POA: Diagnosis not present

## 2020-02-18 DIAGNOSIS — G8918 Other acute postprocedural pain: Secondary | ICD-10-CM | POA: Diagnosis not present

## 2020-02-18 DIAGNOSIS — R011 Cardiac murmur, unspecified: Secondary | ICD-10-CM | POA: Diagnosis not present

## 2020-02-19 DIAGNOSIS — Z96643 Presence of artificial hip joint, bilateral: Secondary | ICD-10-CM | POA: Diagnosis not present

## 2020-02-19 DIAGNOSIS — T8484XA Pain due to internal orthopedic prosthetic devices, implants and grafts, initial encounter: Secondary | ICD-10-CM | POA: Diagnosis not present

## 2020-02-19 DIAGNOSIS — I10 Essential (primary) hypertension: Secondary | ICD-10-CM | POA: Diagnosis not present

## 2020-02-19 DIAGNOSIS — G8918 Other acute postprocedural pain: Secondary | ICD-10-CM | POA: Diagnosis not present

## 2020-02-19 DIAGNOSIS — J309 Allergic rhinitis, unspecified: Secondary | ICD-10-CM | POA: Diagnosis not present

## 2020-02-19 DIAGNOSIS — R011 Cardiac murmur, unspecified: Secondary | ICD-10-CM | POA: Diagnosis not present

## 2020-02-19 DIAGNOSIS — M25552 Pain in left hip: Secondary | ICD-10-CM | POA: Diagnosis not present

## 2020-02-20 DIAGNOSIS — Z96643 Presence of artificial hip joint, bilateral: Secondary | ICD-10-CM | POA: Diagnosis not present

## 2020-02-20 DIAGNOSIS — J309 Allergic rhinitis, unspecified: Secondary | ICD-10-CM | POA: Diagnosis not present

## 2020-02-20 DIAGNOSIS — I10 Essential (primary) hypertension: Secondary | ICD-10-CM | POA: Diagnosis not present

## 2020-02-20 DIAGNOSIS — T8484XA Pain due to internal orthopedic prosthetic devices, implants and grafts, initial encounter: Secondary | ICD-10-CM | POA: Diagnosis not present

## 2020-02-20 DIAGNOSIS — G8918 Other acute postprocedural pain: Secondary | ICD-10-CM | POA: Diagnosis not present

## 2020-02-20 DIAGNOSIS — R011 Cardiac murmur, unspecified: Secondary | ICD-10-CM | POA: Diagnosis not present

## 2020-02-21 DIAGNOSIS — T84061D Wear of articular bearing surface of internal prosthetic left hip joint, subsequent encounter: Secondary | ICD-10-CM | POA: Diagnosis not present

## 2020-02-21 DIAGNOSIS — Z96643 Presence of artificial hip joint, bilateral: Secondary | ICD-10-CM | POA: Diagnosis not present

## 2020-02-21 DIAGNOSIS — R011 Cardiac murmur, unspecified: Secondary | ICD-10-CM | POA: Diagnosis not present

## 2020-02-21 DIAGNOSIS — Z87440 Personal history of urinary (tract) infections: Secondary | ICD-10-CM | POA: Diagnosis not present

## 2020-02-21 DIAGNOSIS — I10 Essential (primary) hypertension: Secondary | ICD-10-CM | POA: Diagnosis not present

## 2020-02-21 DIAGNOSIS — M19012 Primary osteoarthritis, left shoulder: Secondary | ICD-10-CM | POA: Diagnosis not present

## 2020-02-21 DIAGNOSIS — M19011 Primary osteoarthritis, right shoulder: Secondary | ICD-10-CM | POA: Diagnosis not present

## 2020-02-21 DIAGNOSIS — E039 Hypothyroidism, unspecified: Secondary | ICD-10-CM | POA: Diagnosis not present

## 2020-02-24 DIAGNOSIS — M19011 Primary osteoarthritis, right shoulder: Secondary | ICD-10-CM | POA: Diagnosis not present

## 2020-02-24 DIAGNOSIS — R011 Cardiac murmur, unspecified: Secondary | ICD-10-CM | POA: Diagnosis not present

## 2020-02-24 DIAGNOSIS — I10 Essential (primary) hypertension: Secondary | ICD-10-CM | POA: Diagnosis not present

## 2020-02-24 DIAGNOSIS — E039 Hypothyroidism, unspecified: Secondary | ICD-10-CM | POA: Diagnosis not present

## 2020-02-24 DIAGNOSIS — M19012 Primary osteoarthritis, left shoulder: Secondary | ICD-10-CM | POA: Diagnosis not present

## 2020-02-24 DIAGNOSIS — T84061D Wear of articular bearing surface of internal prosthetic left hip joint, subsequent encounter: Secondary | ICD-10-CM | POA: Diagnosis not present

## 2020-02-25 DIAGNOSIS — I10 Essential (primary) hypertension: Secondary | ICD-10-CM | POA: Diagnosis not present

## 2020-02-25 DIAGNOSIS — E039 Hypothyroidism, unspecified: Secondary | ICD-10-CM | POA: Diagnosis not present

## 2020-02-25 DIAGNOSIS — R011 Cardiac murmur, unspecified: Secondary | ICD-10-CM | POA: Diagnosis not present

## 2020-02-25 DIAGNOSIS — M19011 Primary osteoarthritis, right shoulder: Secondary | ICD-10-CM | POA: Diagnosis not present

## 2020-02-25 DIAGNOSIS — M19012 Primary osteoarthritis, left shoulder: Secondary | ICD-10-CM | POA: Diagnosis not present

## 2020-02-25 DIAGNOSIS — T84061D Wear of articular bearing surface of internal prosthetic left hip joint, subsequent encounter: Secondary | ICD-10-CM | POA: Diagnosis not present

## 2020-02-28 DIAGNOSIS — R011 Cardiac murmur, unspecified: Secondary | ICD-10-CM | POA: Diagnosis not present

## 2020-02-28 DIAGNOSIS — E039 Hypothyroidism, unspecified: Secondary | ICD-10-CM | POA: Diagnosis not present

## 2020-02-28 DIAGNOSIS — I10 Essential (primary) hypertension: Secondary | ICD-10-CM | POA: Diagnosis not present

## 2020-02-28 DIAGNOSIS — T84061D Wear of articular bearing surface of internal prosthetic left hip joint, subsequent encounter: Secondary | ICD-10-CM | POA: Diagnosis not present

## 2020-02-28 DIAGNOSIS — M19012 Primary osteoarthritis, left shoulder: Secondary | ICD-10-CM | POA: Diagnosis not present

## 2020-02-28 DIAGNOSIS — M19011 Primary osteoarthritis, right shoulder: Secondary | ICD-10-CM | POA: Diagnosis not present

## 2020-03-03 DIAGNOSIS — T84061D Wear of articular bearing surface of internal prosthetic left hip joint, subsequent encounter: Secondary | ICD-10-CM | POA: Diagnosis not present

## 2020-03-03 DIAGNOSIS — M19012 Primary osteoarthritis, left shoulder: Secondary | ICD-10-CM | POA: Diagnosis not present

## 2020-03-03 DIAGNOSIS — E039 Hypothyroidism, unspecified: Secondary | ICD-10-CM | POA: Diagnosis not present

## 2020-03-03 DIAGNOSIS — R011 Cardiac murmur, unspecified: Secondary | ICD-10-CM | POA: Diagnosis not present

## 2020-03-03 DIAGNOSIS — I10 Essential (primary) hypertension: Secondary | ICD-10-CM | POA: Diagnosis not present

## 2020-03-03 DIAGNOSIS — M19011 Primary osteoarthritis, right shoulder: Secondary | ICD-10-CM | POA: Diagnosis not present

## 2020-03-04 DIAGNOSIS — Z96643 Presence of artificial hip joint, bilateral: Secondary | ICD-10-CM | POA: Diagnosis not present

## 2020-03-04 DIAGNOSIS — Z471 Aftercare following joint replacement surgery: Secondary | ICD-10-CM | POA: Diagnosis not present

## 2020-03-05 DIAGNOSIS — T84061D Wear of articular bearing surface of internal prosthetic left hip joint, subsequent encounter: Secondary | ICD-10-CM | POA: Diagnosis not present

## 2020-03-05 DIAGNOSIS — M19012 Primary osteoarthritis, left shoulder: Secondary | ICD-10-CM | POA: Diagnosis not present

## 2020-03-05 DIAGNOSIS — I10 Essential (primary) hypertension: Secondary | ICD-10-CM | POA: Diagnosis not present

## 2020-03-05 DIAGNOSIS — M19011 Primary osteoarthritis, right shoulder: Secondary | ICD-10-CM | POA: Diagnosis not present

## 2020-03-05 DIAGNOSIS — E039 Hypothyroidism, unspecified: Secondary | ICD-10-CM | POA: Diagnosis not present

## 2020-03-05 DIAGNOSIS — R011 Cardiac murmur, unspecified: Secondary | ICD-10-CM | POA: Diagnosis not present

## 2020-03-06 DIAGNOSIS — T84061D Wear of articular bearing surface of internal prosthetic left hip joint, subsequent encounter: Secondary | ICD-10-CM | POA: Diagnosis not present

## 2020-03-06 DIAGNOSIS — M19012 Primary osteoarthritis, left shoulder: Secondary | ICD-10-CM | POA: Diagnosis not present

## 2020-03-06 DIAGNOSIS — I10 Essential (primary) hypertension: Secondary | ICD-10-CM | POA: Diagnosis not present

## 2020-03-06 DIAGNOSIS — E039 Hypothyroidism, unspecified: Secondary | ICD-10-CM | POA: Diagnosis not present

## 2020-03-06 DIAGNOSIS — M19011 Primary osteoarthritis, right shoulder: Secondary | ICD-10-CM | POA: Diagnosis not present

## 2020-03-06 DIAGNOSIS — R011 Cardiac murmur, unspecified: Secondary | ICD-10-CM | POA: Diagnosis not present

## 2020-03-09 DIAGNOSIS — E039 Hypothyroidism, unspecified: Secondary | ICD-10-CM | POA: Diagnosis not present

## 2020-03-09 DIAGNOSIS — R011 Cardiac murmur, unspecified: Secondary | ICD-10-CM | POA: Diagnosis not present

## 2020-03-09 DIAGNOSIS — M19012 Primary osteoarthritis, left shoulder: Secondary | ICD-10-CM | POA: Diagnosis not present

## 2020-03-09 DIAGNOSIS — T84061D Wear of articular bearing surface of internal prosthetic left hip joint, subsequent encounter: Secondary | ICD-10-CM | POA: Diagnosis not present

## 2020-03-09 DIAGNOSIS — M19011 Primary osteoarthritis, right shoulder: Secondary | ICD-10-CM | POA: Diagnosis not present

## 2020-03-09 DIAGNOSIS — I10 Essential (primary) hypertension: Secondary | ICD-10-CM | POA: Diagnosis not present

## 2020-03-10 DIAGNOSIS — M19011 Primary osteoarthritis, right shoulder: Secondary | ICD-10-CM | POA: Diagnosis not present

## 2020-03-10 DIAGNOSIS — E039 Hypothyroidism, unspecified: Secondary | ICD-10-CM | POA: Diagnosis not present

## 2020-03-10 DIAGNOSIS — I10 Essential (primary) hypertension: Secondary | ICD-10-CM | POA: Diagnosis not present

## 2020-03-10 DIAGNOSIS — M19012 Primary osteoarthritis, left shoulder: Secondary | ICD-10-CM | POA: Diagnosis not present

## 2020-03-10 DIAGNOSIS — R011 Cardiac murmur, unspecified: Secondary | ICD-10-CM | POA: Diagnosis not present

## 2020-03-10 DIAGNOSIS — T84061D Wear of articular bearing surface of internal prosthetic left hip joint, subsequent encounter: Secondary | ICD-10-CM | POA: Diagnosis not present

## 2020-03-12 DIAGNOSIS — I10 Essential (primary) hypertension: Secondary | ICD-10-CM | POA: Diagnosis not present

## 2020-03-12 DIAGNOSIS — M19012 Primary osteoarthritis, left shoulder: Secondary | ICD-10-CM | POA: Diagnosis not present

## 2020-03-12 DIAGNOSIS — R011 Cardiac murmur, unspecified: Secondary | ICD-10-CM | POA: Diagnosis not present

## 2020-03-12 DIAGNOSIS — T84061D Wear of articular bearing surface of internal prosthetic left hip joint, subsequent encounter: Secondary | ICD-10-CM | POA: Diagnosis not present

## 2020-03-12 DIAGNOSIS — E039 Hypothyroidism, unspecified: Secondary | ICD-10-CM | POA: Diagnosis not present

## 2020-03-12 DIAGNOSIS — M19011 Primary osteoarthritis, right shoulder: Secondary | ICD-10-CM | POA: Diagnosis not present

## 2020-03-17 DIAGNOSIS — M19011 Primary osteoarthritis, right shoulder: Secondary | ICD-10-CM | POA: Diagnosis not present

## 2020-03-17 DIAGNOSIS — E039 Hypothyroidism, unspecified: Secondary | ICD-10-CM | POA: Diagnosis not present

## 2020-03-17 DIAGNOSIS — I10 Essential (primary) hypertension: Secondary | ICD-10-CM | POA: Diagnosis not present

## 2020-03-17 DIAGNOSIS — M19012 Primary osteoarthritis, left shoulder: Secondary | ICD-10-CM | POA: Diagnosis not present

## 2020-03-17 DIAGNOSIS — R011 Cardiac murmur, unspecified: Secondary | ICD-10-CM | POA: Diagnosis not present

## 2020-03-17 DIAGNOSIS — T84061D Wear of articular bearing surface of internal prosthetic left hip joint, subsequent encounter: Secondary | ICD-10-CM | POA: Diagnosis not present

## 2020-03-18 DIAGNOSIS — R7303 Prediabetes: Secondary | ICD-10-CM | POA: Diagnosis not present

## 2020-03-18 DIAGNOSIS — M3320 Polymyositis, organ involvement unspecified: Secondary | ICD-10-CM | POA: Diagnosis not present

## 2020-03-18 DIAGNOSIS — I10 Essential (primary) hypertension: Secondary | ICD-10-CM | POA: Diagnosis not present

## 2020-03-18 DIAGNOSIS — E039 Hypothyroidism, unspecified: Secondary | ICD-10-CM | POA: Diagnosis not present

## 2020-03-18 DIAGNOSIS — E782 Mixed hyperlipidemia: Secondary | ICD-10-CM | POA: Diagnosis not present

## 2020-03-22 DIAGNOSIS — M13852 Other specified arthritis, left hip: Secondary | ICD-10-CM | POA: Diagnosis not present

## 2020-03-24 DIAGNOSIS — H353131 Nonexudative age-related macular degeneration, bilateral, early dry stage: Secondary | ICD-10-CM | POA: Diagnosis not present

## 2020-04-01 DIAGNOSIS — Z96641 Presence of right artificial hip joint: Secondary | ICD-10-CM | POA: Diagnosis not present

## 2020-04-01 DIAGNOSIS — Z471 Aftercare following joint replacement surgery: Secondary | ICD-10-CM | POA: Diagnosis not present

## 2020-04-07 DIAGNOSIS — M109 Gout, unspecified: Secondary | ICD-10-CM | POA: Diagnosis not present

## 2020-04-07 DIAGNOSIS — Z23 Encounter for immunization: Secondary | ICD-10-CM | POA: Diagnosis not present

## 2020-04-07 DIAGNOSIS — E039 Hypothyroidism, unspecified: Secondary | ICD-10-CM | POA: Diagnosis not present

## 2020-04-07 DIAGNOSIS — E782 Mixed hyperlipidemia: Secondary | ICD-10-CM | POA: Diagnosis not present

## 2020-04-07 DIAGNOSIS — I10 Essential (primary) hypertension: Secondary | ICD-10-CM | POA: Diagnosis not present

## 2020-04-07 DIAGNOSIS — M3320 Polymyositis, organ involvement unspecified: Secondary | ICD-10-CM | POA: Diagnosis not present

## 2020-04-07 DIAGNOSIS — Z0000 Encounter for general adult medical examination without abnormal findings: Secondary | ICD-10-CM | POA: Diagnosis not present

## 2020-04-07 DIAGNOSIS — R7303 Prediabetes: Secondary | ICD-10-CM | POA: Diagnosis not present

## 2020-04-20 ENCOUNTER — Ambulatory Visit (INDEPENDENT_AMBULATORY_CARE_PROVIDER_SITE_OTHER): Payer: Medicare Other | Admitting: Urology

## 2020-04-20 ENCOUNTER — Other Ambulatory Visit: Payer: Self-pay

## 2020-04-20 VITALS — BP 141/78 | HR 80

## 2020-04-20 DIAGNOSIS — N3020 Other chronic cystitis without hematuria: Secondary | ICD-10-CM | POA: Diagnosis not present

## 2020-04-20 MED ORDER — CEPHALEXIN 250 MG PO CAPS: 250.0000 mg | ORAL_CAPSULE | Freq: Every day | ORAL | 3 refills | Status: DC

## 2020-04-20 NOTE — Progress Notes (Signed)
04/20/2020 10:37 AM   Cassidy Bell 07-Jan-1941 [id number]  Referring provider: Merrilee Seashore, [place],  [name] [address]  No chief complaint on file.   HPI: I dictated a long note April 2018.  She had mild mixed incontinence and mild prolapse symptoms but primarily urgency incontinence.  Trimethoprim was working well for 5 bladder infections and she had trouble to urinate on Vesicare and oxybutynin.  She understood treatment goals regarding prolapse surgery though it may have been aggravating a mild elevated residual urine volume.  Myrbetriq was tried   She has been taking daily trimethoprim. She has prolapse noted. Frequency was improving and she never took the beta 3 agonists.  In her last visit 2 years ago she was doing well on trimethoprim with watchful waiting for prolapse.  Takes vaginal estrogen 3 times a week prescribed by nurse practitioner.  Finds it too expensive at $100 and stopped it.  Has done very well on trimethoprim but in the last 2 weeks has foul-smelling urine.    Patient trimethoprim renewed.  Culture sent and we will call regardless of results.  She was told that Estrace may be cheaper and this prescription was changed.  We talked about slowly titrating downward for convenience and reduce cost.    Today Frequency stable.  Last visit in 2020 culture was positive.  Saw a nurse practitioner January 2021 and had back pain and a negative urinalysis and normal residual  2 prescription issue patient doing well on daily Macrodantin.  49-month supply is a little bit expensive just over $120.  She just finished 90-day prescription getting filled.  Frequency stable and clinically not infected today    PMH: Past Medical History:  Diagnosis Date  . Arthritis   . Arthritis   . Atrophic vaginitis   . Bladder prolapse, female, acquired   . 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 Hypertension   . Hypothyroidism   .  Neuromuscular disorder ([place])   . Polymyositis ([place])    in past. still has some weakness  . Urinary retention   . Wears dentures    full upper, partial lower    Surgical History: Past Surgical History:  Procedure Laterality Date  . ABDOMINAL HYSTERECTOMY    . bladder tach  1993  . CARPAL TUNNEL RELEASE    . CATARACT EXTRACTION W/PHACO Left 03/21/2018   Procedure: CATARACT EXTRACTION PHACO AND INTRAOCULAR LENS PLACEMENT ([facility])  LEFT TORIC LENS;  Surgeon: Leandrew Koyanagi, MD;  Location: [facility];  Service: Ophthalmology;  Laterality: Left;  . CATARACT EXTRACTION W/PHACO Right 04/11/2018   Procedure: CATARACT EXTRACTION PHACO AND INTRAOCULAR LENS PLACEMENT ([place])  RIGHT TORIC LENS;  Surgeon: Leandrew Koyanagi, MD;  Location: [facility];  Service: Ophthalmology;  Laterality: Right;  . COLONOSCOPY    . MUSCLE BIOPSY    . REPLACEMENT TOTAL HIP W/  RESURFACING IMPLANTS  1996   Right & Left     Home Medications:  Allergies as of 04/20/2020      Reactions   Bactrim [sulfamethoxazole-trimethoprim] Nausea And Vomiting   Pollen Extract Other (See Comments)   respiratory   Sulfa Antibiotics Rash   Other reaction(s): Other (See Comments) respiratory And GI Upset   Sulfacetamide Sodium Rash   And GI Upset   Sulfamethoxazole-trimethoprim Rash      Medication List       Accurate as of April 20, 2020 10:37 AM. If you have any questions, ask your  nurse or doctor.        acetaminophen 500 MG tablet Commonly known as: TYLENOL Take 500 mg by mouth.   amoxicillin 500 MG capsule Commonly known as: AMOXIL SMARTSIG:4 Capsule(s) By Mouth Once   ARTIFICIAL TEARS OP Apply to eye as needed.   aspirin 81 MG tablet Take 81 mg by mouth daily.   benazepril 10 MG tablet Commonly known as: LOTENSIN Take 10 mg by mouth daily.   CALCIUM 600 + D PO Take by mouth. Reported on 08/25/2015   cetirizine 10 MG tablet Commonly known as: ZYRTEC Take 10 mg by mouth  daily.   CINNAMON PO Take by mouth daily.   estradiol 0.1 MG/GM vaginal cream Commonly known as: ESTRACE 3 times a week one pea size amount   fluticasone 50 MCG/ACT nasal spray Commonly known as: FLONASE Place 2 sprays into the nose daily.   levothyroxine 100 MCG tablet Commonly known as: SYNTHROID TK 1 T PO QD IN THE MORNING OES   multivitamin tablet Take 1 tablet by mouth daily.   nitrofurantoin 100 MG capsule Commonly known as: Macrodantin Take 1 capsule (100 mg total) by mouth daily.   PROBIOTIC DAILY PO Take by mouth daily.   trimethoprim 100 MG tablet Commonly known as: TRIMPEX Take 1 tablet (100 mg total) by mouth daily.   Vitamin D 50 MCG (2000 UT) Caps Take by mouth.       Allergies:  Allergies  Allergen Reactions  . Bactrim [Sulfamethoxazole-Trimethoprim] Nausea And Vomiting  . Pollen Extract Other (See Comments)    respiratory  . Sulfa Antibiotics Rash    Other reaction(s): Other (See Comments) respiratory And GI Upset  . Sulfacetamide Sodium Rash    And GI Upset  . Sulfamethoxazole-Trimethoprim Rash    Family History: Family History  Problem Relation Age of Onset  . Cancer Father        colon  . Kidney cancer Neg Hx   . Renal cancer Neg Hx   . Bladder Cancer Neg Hx   . Kidney disease Neg Hx   . Breast cancer Neg Hx     Social History:  reports that she has never smoked. She has never used smokeless tobacco. She reports that she does not drink alcohol and does not use drugs.  ROS:                                        Physical Exam: There were no vitals taken for this visit.  Constitutional:  Alert and oriented, No acute distress.  Laboratory Data: Lab Results  Component Value Date   WBC 7.9 06/30/2014   HGB 10.5 (L) 07/01/2014   HCT 33.5 (L) 06/30/2014   MCV 89 06/30/2014   PLT 174 06/30/2014    Lab Results  Component Value Date   CREATININE 0.57 (L) 06/30/2014    No results found for:  PSA  No results found for: TESTOSTERONE  No results found for: HGBA1C  Urinalysis    Component Value Date/Time   COLORURINE Yellow 06/28/2014 1216   APPEARANCEUR Cloudy (A) 10/31/2019 1131   LABSPEC 1.004 06/28/2014 1216   PHURINE 7.0 06/28/2014 1216   GLUCOSEU Negative 10/31/2019 1131   GLUCOSEU Negative 06/28/2014 1216   HGBUR Negative 06/28/2014 1216   BILIRUBINUR Negative 10/31/2019 1131   BILIRUBINUR Negative 06/28/2014 1216   KETONESUR Negative 06/28/2014 1216   PROTEINUR Negative 10/31/2019 1131  PROTEINUR Negative 06/28/2014 1216   NITRITE Positive (A) 10/31/2019 1131   NITRITE Positive 06/28/2014 1216   LEUKOCYTESUR 1+ (A) 10/31/2019 1131   LEUKOCYTESUR 3+ 06/28/2014 1216    Pertinent Imaging:   Assessment & Plan: Prescription was renewed recently.  I hand-delivered Keflex 250 mg daily 90 tablets x 3 refills and she will ask her pharmacy if it is much cheaper and if it is she will switch to it in about 3 months.  Reassess in 1 year  There are no diagnoses linked to this encounter.  No follow-ups on file.  Reece Packer, MD  [facility] [address] [place], [place] [address] [phone]

## 2020-06-10 DIAGNOSIS — Z23 Encounter for immunization: Secondary | ICD-10-CM | POA: Diagnosis not present

## 2020-07-20 ENCOUNTER — Telehealth: Payer: Self-pay

## 2020-07-20 NOTE — Telephone Encounter (Signed)
Incoming call from pt on triage line stating that she believes she has a UTI and wants to know if she can double up on preventative antibiotic she has at home.  Called pt advised her that we would need to see her in office for UTI evaluation. Pt scheduled for 07/21/20. Pt gave verbal understanding.

## 2020-07-21 ENCOUNTER — Encounter: Payer: Self-pay | Admitting: Physician Assistant

## 2020-07-21 ENCOUNTER — Ambulatory Visit (INDEPENDENT_AMBULATORY_CARE_PROVIDER_SITE_OTHER): Payer: Medicare Other | Admitting: Physician Assistant

## 2020-07-21 ENCOUNTER — Other Ambulatory Visit: Payer: Self-pay

## 2020-07-21 VITALS — BP 137/80 | HR 79 | Ht 63.0 in | Wt 160.0 lb

## 2020-07-21 DIAGNOSIS — N390 Urinary tract infection, site not specified: Secondary | ICD-10-CM | POA: Diagnosis not present

## 2020-07-21 LAB — URINALYSIS, COMPLETE
Bilirubin, UA: NEGATIVE
Glucose, UA: NEGATIVE
Ketones, UA: NEGATIVE
Nitrite, UA: POSITIVE — AB
Protein,UA: NEGATIVE
RBC, UA: NEGATIVE
Specific Gravity, UA: 1.02 (ref 1.005–1.030)
Urobilinogen, Ur: 0.2 mg/dL (ref 0.2–1.0)
pH, UA: 7 (ref 5.0–7.5)

## 2020-07-21 LAB — MICROSCOPIC EXAMINATION

## 2020-07-21 MED ORDER — CIPROFLOXACIN HCL 250 MG PO TABS: 250.0000 mg | ORAL_TABLET | Freq: Two times a day (BID) | ORAL | 0 refills | Status: DC

## 2020-07-21 MED ORDER — AMOXICILLIN-POT CLAVULANATE 875-125 MG PO TABS: 1.0000 | ORAL_TABLET | Freq: Two times a day (BID) | ORAL | 0 refills | Status: DC

## 2020-07-21 MED ORDER — AMOXICILLIN-POT CLAVULANATE 875-125 MG PO TABS: 1.0000 | ORAL_TABLET | Freq: Two times a day (BID) | ORAL | 0 refills | Status: AC

## 2020-07-21 NOTE — Patient Instructions (Signed)
1. Start Augmentin for treatment of a UTI today. 2. Stop daily Keflex (cephalexin) while you are taking Augmentin. You may resume this medication the day after you complete Augmentin. 3. Start a daily over-the-counter probiotic containing the bacteria called lactobacillus.

## 2020-07-21 NOTE — Progress Notes (Signed)
07/21/2020 11:04 AM   Cassidy Bell 06/12/1941 [id number]  CC: Chief Complaint  Patient presents with  . Urinary Tract Infection   HPI: Cassidy Bell is a 79 y.o. female with PMH recurrent UTI on suppressive Keflex who presents today for evaluation of possible UTI.  Today she reports an approximate 10-day history of low back pain, fatigue, and malodorous urine.  She denies dysuria.  At the time of symptom onset, she was taking suppressive daily Macrobid, however she switched to the Keflex 5 days ago.  She has not taken any other medications at home for management of her symptoms.  She was previously recommended to use topical vaginal estrogen cream, however she has discontinued this.  In-office UA today positive for nitrites and 2+ leukocyte esterase; urine microscopy with 6-10 WBCs/HPF and many bacteria.   PMH: Past Medical History:  Diagnosis Date  . Arthritis   . Arthritis   . Atrophic vaginitis   . Bladder prolapse, female, acquired   . 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 Hypertension   . Hypothyroidism   . Neuromuscular disorder ([place])   . Polymyositis ([place])    in past. still has some weakness  . Urinary retention   . Wears dentures    full upper, partial lower    Surgical History: Past Surgical History:  Procedure Laterality Date  . ABDOMINAL HYSTERECTOMY    . bladder tach  1993  . CARPAL TUNNEL RELEASE    . CATARACT EXTRACTION W/PHACO Left 03/21/2018   Procedure: CATARACT EXTRACTION PHACO AND INTRAOCULAR LENS PLACEMENT ([place])  LEFT TORIC LENS;  Surgeon: Leandrew Koyanagi, MD;  Location: [facility];  Service: Ophthalmology;  Laterality: Left;  . CATARACT EXTRACTION W/PHACO Right 04/11/2018   Procedure: CATARACT EXTRACTION PHACO AND INTRAOCULAR LENS PLACEMENT ([place])  RIGHT TORIC LENS;  Surgeon: Leandrew Koyanagi, MD;  Location: [facility];  Service: Ophthalmology;  Laterality: Right;  . COLONOSCOPY    . MUSCLE BIOPSY     . REPLACEMENT TOTAL HIP W/  RESURFACING IMPLANTS  1996   Right & Left     Home Medications:  Allergies as of 07/21/2020      Reactions   Bactrim [sulfamethoxazole-trimethoprim] Nausea And Vomiting   Pollen Extract Other (See Comments)   respiratory   Sulfa Antibiotics Rash   Other reaction(s): Other (See Comments) respiratory And GI Upset   Sulfacetamide Sodium Rash   And GI Upset   Sulfamethoxazole-trimethoprim Rash      Medication List       Accurate as of July 21, 2020 11:04 AM. If you have any questions, ask your nurse or doctor.        acetaminophen 500 MG tablet Commonly known as: TYLENOL Take 500 mg by mouth.   ARTIFICIAL TEARS OP Apply to eye as needed.   aspirin 81 MG tablet Take 81 mg by mouth daily.   benazepril 10 MG tablet Commonly known as: LOTENSIN Take 10 mg by mouth daily.   CALCIUM 600 + D PO Take by mouth. Reported on 08/25/2015   cephALEXin 250 MG capsule Commonly known as: Keflex Take 1 capsule (250 mg total) by mouth daily.   cetirizine 10 MG tablet Commonly known as: ZYRTEC Take 10 mg by mouth daily.   CINNAMON PO Take by mouth daily.   conjugated estrogens vaginal cream Commonly known as: PREMARIN Place vaginally.   fluticasone 50 MCG/ACT nasal spray Commonly known as: FLONASE Place 2 sprays into the nose daily.  levothyroxine 100 MCG tablet Commonly known as: SYNTHROID TK 1 T PO QD IN THE MORNING OES   multivitamin tablet Take 1 tablet by mouth daily.   nitrofurantoin 100 MG capsule Commonly known as: Macrodantin Take 1 capsule (100 mg total) by mouth daily.   PROBIOTIC DAILY PO Take by mouth daily.   Vitamin D 50 MCG (2000 UT) Caps Take by mouth.       Allergies:  Allergies  Allergen Reactions  . Bactrim [Sulfamethoxazole-Trimethoprim] Nausea And Vomiting  . Pollen Extract Other (See Comments)    respiratory  . Sulfa Antibiotics Rash    Other reaction(s): Other (See Comments) respiratory And  GI Upset  . Sulfacetamide Sodium Rash    And GI Upset  . Sulfamethoxazole-Trimethoprim Rash    Family History: Family History  Problem Relation Age of Onset  . Cancer Father        colon  . Kidney cancer Neg Hx   . Renal cancer Neg Hx   . Bladder Cancer Neg Hx   . Kidney disease Neg Hx   . Breast cancer Neg Hx     Social History:   reports that she has never smoked. She has never used smokeless tobacco. She reports that she does not drink alcohol and does not use drugs.  Physical Exam: BP 137/80 (BP Location: Left Arm, Patient Position: Sitting, Cuff Size: Normal)   Pulse 79   Ht 5\' 3"  (1.6 m)   Wt 160 lb (72.6 kg)   BMI 28.34 kg/m   Constitutional:  Alert and oriented, no acute distress, nontoxic appearing HEENT: [place], AT Cardiovascular: No clubbing, cyanosis, or edema Respiratory: Normal respiratory effort, no increased work of breathing Skin: No rashes, bruises or suspicious lesions Neurologic: Grossly intact, no focal deficits, moving all 4 extremities Psychiatric: Normal mood and affect  Laboratory Data: Results for orders placed or performed in visit on 07/21/20  Microscopic Examination   Urine  Result Value Ref Range   WBC, UA 6-10 (A) 0 - 5 /hpf   RBC 0-2 0 - 2 /hpf   Epithelial Cells (non renal) 0-10 0 - 10 /hpf   Bacteria, UA Many (A) None seen/Few  Urinalysis, Complete  Result Value Ref Range   Specific Gravity, UA 1.020 1.005 - 1.030   pH, UA 7.0 5.0 - 7.5   Color, UA Straw Yellow   Appearance Ur Cloudy (A) Clear   Leukocytes,UA 2+ (A) Negative   Protein,UA Negative Negative/Trace   Glucose, UA Negative Negative   Ketones, UA Negative Negative   RBC, UA Negative Negative   Bilirubin, UA Negative Negative   Urobilinogen, Ur 0.2 0.2 - 1.0 mg/dL   Nitrite, UA Positive (A) Negative   Microscopic Examination See below:    Assessment & Plan:   1. Recurrent UTI UA nitrite positive with mild pyuria and bacteriuria despite suppressive antibiotics.  Will  start empiric Augmentin and send for culture for further evaluation.  Counseled patient to start a lactobacillus containing daily probiotic given suppressive antibiotic use.  She expressed understanding. - Urinalysis, Complete - CULTURE, URINE COMPREHENSIVE - amoxicillin-clavulanate (AUGMENTIN) 875-125 MG tablet; Take 1 tablet by mouth 2 (two) times daily for 7 days.  Dispense: 14 tablet; Refill: 0   Return if symptoms worsen or fail to improve.  07/23/20, PA-C  [facility] [address], [address] [place], [place] [phone]

## 2020-07-26 LAB — CULTURE, URINE COMPREHENSIVE

## 2020-07-27 ENCOUNTER — Telehealth: Payer: Self-pay | Admitting: Physician Assistant

## 2020-07-27 MED ORDER — CIPROFLOXACIN HCL 250 MG PO TABS: 250.0000 mg | ORAL_TABLET | Freq: Two times a day (BID) | ORAL | 0 refills | Status: AC

## 2020-07-27 NOTE — Telephone Encounter (Signed)
Please contact the patient and inform her that her recent urine culture has come back resistant to the Augmentin I prescribed.  I would like her to stop this and start Cipro as an alternative.  I sent a prescription for Cipro 250 mg twice daily x5 days to [facility] in [place].

## 2020-07-27 NOTE — Telephone Encounter (Signed)
Notified patient as advised, patient expressed understanding.  

## 2020-11-30 ENCOUNTER — Other Ambulatory Visit: Payer: Self-pay | Admitting: Internal Medicine

## 2020-11-30 DIAGNOSIS — Z1231 Encounter for screening mammogram for malignant neoplasm of breast: Secondary | ICD-10-CM

## 2020-12-03 ENCOUNTER — Encounter: Payer: Self-pay | Admitting: Dermatology

## 2020-12-03 ENCOUNTER — Ambulatory Visit (INDEPENDENT_AMBULATORY_CARE_PROVIDER_SITE_OTHER): Payer: Medicare Other | Admitting: Dermatology

## 2020-12-03 ENCOUNTER — Other Ambulatory Visit: Payer: Self-pay

## 2020-12-03 DIAGNOSIS — Z86018 Personal history of other benign neoplasm: Secondary | ICD-10-CM

## 2020-12-03 DIAGNOSIS — Z1283 Encounter for screening for malignant neoplasm of skin: Secondary | ICD-10-CM

## 2020-12-03 DIAGNOSIS — D229 Melanocytic nevi, unspecified: Secondary | ICD-10-CM | POA: Diagnosis not present

## 2020-12-03 DIAGNOSIS — L814 Other melanin hyperpigmentation: Secondary | ICD-10-CM | POA: Diagnosis not present

## 2020-12-03 DIAGNOSIS — D1800 Hemangioma unspecified site: Secondary | ICD-10-CM | POA: Diagnosis not present

## 2020-12-03 DIAGNOSIS — L821 Other seborrheic keratosis: Secondary | ICD-10-CM | POA: Diagnosis not present

## 2020-12-03 DIAGNOSIS — L578 Other skin changes due to chronic exposure to nonionizing radiation: Secondary | ICD-10-CM

## 2020-12-03 NOTE — Progress Notes (Signed)
   Follow-Up Visit   Subjective  Cassidy Bell is a 80 y.o. female who presents for the following: Annual Exam (Mole check ). Hx of Dysplastic nevus left deltoid inferior 2009. The patient presents for Upper Body Skin Exam (UBSE) for skin cancer screening and mole check.  The following portions of the chart were reviewed this encounter and updated as appropriate:   Tobacco  Allergies  Meds  Problems  Med Hx  Surg Hx  Fam Hx     Review of Systems:  No other skin or systemic complaints except as noted in HPI or Assessment and Plan.  Objective  Well appearing patient in no apparent distress; mood and affect are within normal limits.  All skin waist up examined.  Objective  Left deltoid inferior: Scar with no evidence of recurrence.    Assessment & Plan  History of dysplastic nevus Left deltoid inferior  Clear. Observe for recurrence. Call clinic for new or changing lesions.  Recommend regular skin exams, daily broad-spectrum spf 30+ sunscreen use, and photoprotection.     Skin cancer screening   Lentigines - Scattered tan macules - Due to sun exposure - Benign-appering, observe - Recommend daily broad spectrum sunscreen SPF 30+ to sun-exposed areas, reapply every 2 hours as needed. - Call for any changes  Seborrheic Keratoses - Stuck-on, waxy, tan-brown papules and/or plaques  - Benign-appearing - Discussed benign etiology and prognosis. - Observe - Call for any changes  Melanocytic Nevi - Tan-brown and/or pink-flesh-colored symmetric macules and papules - Benign appearing on exam today - Observation - Call clinic for new or changing moles - Recommend daily use of broad spectrum spf 30+ sunscreen to sun-exposed areas.   Hemangiomas - Red papules - Discussed benign nature - Observe - Call for any changes  Actinic Damage - Chronic condition, secondary to cumulative UV/sun exposure - diffuse scaly erythematous macules with underlying dyspigmentation -  Recommend daily broad spectrum sunscreen SPF 30+ to sun-exposed areas, reapply every 2 hours as needed.  - Staying in the shade or wearing long sleeves, sun glasses (UVA+UVB protection) and wide brim hats (4-inch brim around the entire circumference of the hat) are also recommended for sun protection.  - Call for new or changing lesions.  Skin cancer screening performed today.  Return in about 1 year (around 12/03/2021) for TBSE, hx of Dysplastic nevus .  IMarye Round, CMA, am acting as scribe for Sarina Ser, MD .  Documentation: I have reviewed the above documentation for accuracy and completeness, and I agree with the above.  Sarina Ser, MD

## 2020-12-03 NOTE — Patient Instructions (Signed)

## 2020-12-08 ENCOUNTER — Encounter: Payer: Self-pay | Admitting: Dermatology

## 2021-01-13 DIAGNOSIS — T84021D Dislocation of internal left hip prosthesis, subsequent encounter: Secondary | ICD-10-CM | POA: Diagnosis not present

## 2021-01-13 DIAGNOSIS — Z882 Allergy status to sulfonamides status: Secondary | ICD-10-CM | POA: Diagnosis not present

## 2021-01-13 DIAGNOSIS — M461 Sacroiliitis, not elsewhere classified: Secondary | ICD-10-CM | POA: Diagnosis not present

## 2021-01-13 DIAGNOSIS — Z471 Aftercare following joint replacement surgery: Secondary | ICD-10-CM | POA: Diagnosis not present

## 2021-01-13 DIAGNOSIS — Z96643 Presence of artificial hip joint, bilateral: Secondary | ICD-10-CM | POA: Diagnosis not present

## 2021-01-13 DIAGNOSIS — Z96642 Presence of left artificial hip joint: Secondary | ICD-10-CM | POA: Diagnosis not present

## 2021-02-16 ENCOUNTER — Other Ambulatory Visit: Payer: Self-pay

## 2021-02-16 ENCOUNTER — Ambulatory Visit
Admission: RE | Admit: 2021-02-16 | Discharge: 2021-02-16 | Disposition: A | Discharge status: Home / Self Care | Payer: Medicare Other | Source: Ambulatory Visit | Attending: Internal Medicine | Admitting: Internal Medicine

## 2021-02-16 DIAGNOSIS — Z1231 Encounter for screening mammogram for malignant neoplasm of breast: Secondary | ICD-10-CM | POA: Diagnosis not present

## 2021-03-25 DIAGNOSIS — H353131 Nonexudative age-related macular degeneration, bilateral, early dry stage: Secondary | ICD-10-CM | POA: Diagnosis not present

## 2021-04-06 DIAGNOSIS — E039 Hypothyroidism, unspecified: Secondary | ICD-10-CM | POA: Diagnosis not present

## 2021-04-06 DIAGNOSIS — I10 Essential (primary) hypertension: Secondary | ICD-10-CM | POA: Diagnosis not present

## 2021-04-06 DIAGNOSIS — R7303 Prediabetes: Secondary | ICD-10-CM | POA: Diagnosis not present

## 2021-04-06 DIAGNOSIS — E782 Mixed hyperlipidemia: Secondary | ICD-10-CM | POA: Diagnosis not present

## 2021-04-06 DIAGNOSIS — M3320 Polymyositis, organ involvement unspecified: Secondary | ICD-10-CM | POA: Diagnosis not present

## 2021-04-13 DIAGNOSIS — E039 Hypothyroidism, unspecified: Secondary | ICD-10-CM | POA: Diagnosis not present

## 2021-04-13 DIAGNOSIS — M109 Gout, unspecified: Secondary | ICD-10-CM | POA: Diagnosis not present

## 2021-04-13 DIAGNOSIS — Z0000 Encounter for general adult medical examination without abnormal findings: Secondary | ICD-10-CM | POA: Diagnosis not present

## 2021-04-13 DIAGNOSIS — M3320 Polymyositis, organ involvement unspecified: Secondary | ICD-10-CM | POA: Diagnosis not present

## 2021-04-13 DIAGNOSIS — E782 Mixed hyperlipidemia: Secondary | ICD-10-CM | POA: Diagnosis not present

## 2021-04-13 DIAGNOSIS — I10 Essential (primary) hypertension: Secondary | ICD-10-CM | POA: Diagnosis not present

## 2021-04-13 DIAGNOSIS — R7303 Prediabetes: Secondary | ICD-10-CM | POA: Diagnosis not present

## 2021-04-13 DIAGNOSIS — Z23 Encounter for immunization: Secondary | ICD-10-CM | POA: Diagnosis not present

## 2021-04-13 DIAGNOSIS — M150 Primary generalized (osteo)arthritis: Secondary | ICD-10-CM | POA: Diagnosis not present

## 2021-04-23 DIAGNOSIS — E039 Hypothyroidism, unspecified: Secondary | ICD-10-CM | POA: Diagnosis not present

## 2021-04-23 DIAGNOSIS — E782 Mixed hyperlipidemia: Secondary | ICD-10-CM | POA: Diagnosis not present

## 2021-04-23 DIAGNOSIS — M150 Primary generalized (osteo)arthritis: Secondary | ICD-10-CM | POA: Diagnosis not present

## 2021-04-23 DIAGNOSIS — I10 Essential (primary) hypertension: Secondary | ICD-10-CM | POA: Diagnosis not present

## 2021-04-26 ENCOUNTER — Ambulatory Visit: Payer: Self-pay | Admitting: Urology

## 2021-05-10 ENCOUNTER — Other Ambulatory Visit: Payer: Self-pay

## 2021-05-10 ENCOUNTER — Ambulatory Visit (INDEPENDENT_AMBULATORY_CARE_PROVIDER_SITE_OTHER): Payer: Medicare Other | Admitting: Urology

## 2021-05-10 ENCOUNTER — Encounter: Payer: Self-pay | Admitting: Urology

## 2021-05-10 VITALS — BP 142/86 | HR 78 | Ht 63.0 in | Wt 160.0 lb

## 2021-05-10 DIAGNOSIS — N3020 Other chronic cystitis without hematuria: Secondary | ICD-10-CM

## 2021-05-10 MED ORDER — CEPHALEXIN 250 MG PO CAPS: 250.0000 mg | ORAL_CAPSULE | Freq: Every day | ORAL | 3 refills | Status: DC

## 2021-05-10 NOTE — Progress Notes (Signed)
05/10/2021 10:17 AM   Cassidy Bell 10/03/1940 [id number]  Referring provider: Merrilee Seashore, MD [address] [place],  [place] [address]  Chief Complaint  Patient presents with   Follow-up    HPI: I dictated a long note April 2018.  She had mild mixed incontinence and mild prolapse symptoms but primarily urgency incontinence.  Trimethoprim was working well for 5 bladder infections and she had trouble to urinate on Vesicare and oxybutynin.  She understood treatment goals regarding prolapse surgery though it may have been aggravating a mild elevated residual urine volume.  Myrbetriq was tried    She has been taking daily trimethoprim. She has prolapse noted. Frequency was improving and she never took the beta 3 agonists.   In her last visit 2 years ago she was doing well on trimethoprim with watchful waiting for prolapse.  Takes vaginal estrogen 3 times a week prescribed by nurse practitioner.  Finds it too expensive at $100 and stopped it.     She was told that Estrace may be cheaper and this prescription was changed.  We talked about slowly titrating downward for convenience and reduce cost.     Last visit in 2020 culture was positive.  Saw a nurse practitioner January 2021 and had back pain and a negative urinalysis and normal residual   2 prescription issue patient doing well on daily Macrodantin.  28-month supply is a little bit expensive just over $120.  She just finished 90-day prescription getting filled.    Prescription was renewed recently.  I hand-delivered Keflex 250 mg daily 90 tablets x 3 refills and she will ask her pharmacy if it is much cheaper and if it is she will switch to it in about 3 months.    Today Frequency stable.  It looks like she had a bladder infection 5 days after starting the Keflex many months ago.  No infections in the last many months.  Clinically not infected today   PMH: Past Medical History:  Diagnosis Date    Arthritis    Arthritis    Atrophic vaginitis    Bladder prolapse, female, acquired    Dysplastic nevus 12/24/2007   left inf deltoid   Hypertension    Hypothyroidism    Neuromuscular disorder ([place])    Polymyositis ([place])    in past. still has some weakness   Urinary retention    Wears dentures    full upper, partial lower    Surgical History: Past Surgical History:  Procedure Laterality Date   ABDOMINAL HYSTERECTOMY     bladder tach  1993   CARPAL TUNNEL RELEASE     CATARACT EXTRACTION W/PHACO Left 03/21/2018   Procedure: CATARACT EXTRACTION PHACO AND INTRAOCULAR LENS PLACEMENT ([place])  LEFT TORIC LENS;  Surgeon: Leandrew Koyanagi, MD;  Location: [facility];  Service: Ophthalmology;  Laterality: Left;   CATARACT EXTRACTION W/PHACO Right 04/11/2018   Procedure: CATARACT EXTRACTION PHACO AND INTRAOCULAR LENS PLACEMENT ([place])  RIGHT TORIC LENS;  Surgeon: Leandrew Koyanagi, MD;  Location: [facility];  Service: Ophthalmology;  Laterality: Right;   COLONOSCOPY     MUSCLE BIOPSY     REPLACEMENT TOTAL HIP W/  RESURFACING IMPLANTS  1996   Right & Left     Home Medications:  Allergies as of 05/10/2021       Reactions   Bactrim [sulfamethoxazole-trimethoprim] Nausea And Vomiting   Pollen Extract Other (See Comments)   respiratory   Sulfa Antibiotics Rash   Other reaction(s): Other (  See Comments) respiratory And GI Upset   Sulfacetamide Sodium Rash   And GI Upset   Sulfamethoxazole-trimethoprim Rash        Medication List        Accurate as of May 10, 2021 10:17 AM. If you have any questions, ask your nurse or doctor.          STOP taking these medications    nitrofurantoin 100 MG capsule Commonly known as: Macrodantin Stopped by: Reece Packer, MD       TAKE these medications    acetaminophen 500 MG tablet Commonly known as: TYLENOL Take 500 mg by mouth.   ARTIFICIAL TEARS OP Apply to eye as needed.   aspirin 81 MG  tablet Take 81 mg by mouth daily.   benazepril 10 MG tablet Commonly known as: LOTENSIN Take 10 mg by mouth daily.   CALCIUM 600 + D PO Take by mouth. Reported on 08/25/2015   cephALEXin 250 MG capsule Commonly known as: Keflex Take 1 capsule (250 mg total) by mouth daily.   cetirizine 10 MG tablet Commonly known as: ZYRTEC Take 10 mg by mouth daily.   CINNAMON PO Take by mouth daily.   conjugated estrogens 0.625 MG/GM vaginal cream Commonly known as: PREMARIN Place vaginally.   fluticasone 50 MCG/ACT nasal spray Commonly known as: FLONASE Place 2 sprays into the nose daily.   levothyroxine 100 MCG tablet Commonly known as: SYNTHROID TK 1 T PO QD IN THE MORNING OES   multivitamin tablet Take 1 tablet by mouth daily.   PROBIOTIC DAILY PO Take by mouth daily.   Vitamin D 50 MCG (2000 UT) Caps Take by mouth.        Allergies:  Allergies  Allergen Reactions   Bactrim [Sulfamethoxazole-Trimethoprim] Nausea And Vomiting   Pollen Extract Other (See Comments)    respiratory   Sulfa Antibiotics Rash    Other reaction(s): Other (See Comments) respiratory And GI Upset   Sulfacetamide Sodium Rash    And GI Upset   Sulfamethoxazole-Trimethoprim Rash    Family History: Family History  Problem Relation Age of Onset   Cancer Father        colon   Kidney cancer Neg Hx    Renal cancer Neg Hx    Bladder Cancer Neg Hx    Kidney disease Neg Hx    Breast cancer Neg Hx     Social History:  reports that she has never smoked. She has never used smokeless tobacco. She reports that she does not drink alcohol and does not use drugs.  ROS:                                        Physical Exam: BP (!) 142/86   Pulse 78   Ht 5\' 3"  (1.6 m)   Wt 72.6 kg   BMI 28.34 kg/m     Laboratory Data: Lab Results  Component Value Date   WBC 7.9 06/30/2014   HGB 10.5 (L) 07/01/2014   HCT 33.5 (L) 06/30/2014   MCV 89 06/30/2014   PLT 174  06/30/2014    Lab Results  Component Value Date   CREATININE 0.57 (L) 06/30/2014    No results found for: PSA  No results found for: TESTOSTERONE  No results found for: HGBA1C  Urinalysis    Component Value Date/Time   COLORURINE Yellow 06/28/2014 1216   APPEARANCEUR Cloudy (  A) 07/21/2020 1023   LABSPEC 1.004 06/28/2014 1216   PHURINE 7.0 06/28/2014 1216   GLUCOSEU Negative 07/21/2020 1023   GLUCOSEU Negative 06/28/2014 1216   HGBUR Negative 06/28/2014 1216   BILIRUBINUR Negative 07/21/2020 1023   BILIRUBINUR Negative 06/28/2014 1216   KETONESUR Negative 06/28/2014 1216   PROTEINUR Negative 07/21/2020 1023   PROTEINUR Negative 06/28/2014 1216   NITRITE Positive (A) 07/21/2020 1023   NITRITE Positive 06/28/2014 1216   LEUKOCYTESUR 2+ (A) 07/21/2020 1023   LEUKOCYTESUR 3+ 06/28/2014 1216    Pertinent Imaging:   Assessment & Plan: Keflex 90x3 sent to pharmacy and I will see in 1 year  There are no diagnoses linked to this encounter.  No follow-ups on file.  Reece Packer, MD  [facility] [address], [place], [place] [address] [phone]

## 2021-06-03 ENCOUNTER — Encounter: Payer: Self-pay | Admitting: Urology

## 2021-06-03 ENCOUNTER — Other Ambulatory Visit: Payer: Self-pay

## 2021-06-03 ENCOUNTER — Ambulatory Visit (INDEPENDENT_AMBULATORY_CARE_PROVIDER_SITE_OTHER): Payer: Medicare Other | Admitting: Urology

## 2021-06-03 VITALS — BP 132/75 | HR 101 | Ht 63.0 in | Wt 168.0 lb

## 2021-06-03 DIAGNOSIS — N3000 Acute cystitis without hematuria: Secondary | ICD-10-CM

## 2021-06-03 DIAGNOSIS — R300 Dysuria: Secondary | ICD-10-CM | POA: Diagnosis not present

## 2021-06-03 LAB — MICROSCOPIC EXAMINATION: WBC, UA: >30 /hpf — AB (ref 0–5)

## 2021-06-03 LAB — URINALYSIS, COMPLETE
Bilirubin, UA: NEGATIVE
Glucose, UA: NEGATIVE
Ketones, UA: NEGATIVE
Nitrite, UA: POSITIVE — AB
Protein,UA: NEGATIVE
Specific Gravity, UA: 1.01 (ref 1.005–1.030)
Urobilinogen, Ur: 0.2 mg/dL (ref 0.2–1.0)
pH, UA: 6 (ref 5.0–7.5)

## 2021-06-03 MED ORDER — CIPROFLOXACIN HCL 250 MG PO TABS: 250.0000 mg | ORAL_TABLET | Freq: Two times a day (BID) | ORAL | 0 refills | Status: DC

## 2021-06-03 NOTE — Patient Instructions (Signed)
D-mannose

## 2021-06-03 NOTE — Progress Notes (Signed)
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 Mixed urinary incontinence -Contributing factors of age, vaginal atrophy, cystocele, arthritis and HTN -PVR 300 cc   3. Cystocele -could not tolerate a pessary   HPI: Cassidy Bell is a 80 y.o. female who presents today for burning, odor, back pain and frequency.  CATH UA nitrate positive, greater than 30 WBCs and many bacteria  PVR 300 cc  She states a few days ago she started experiencing lower back pain that was persistent followed by an increase in urinary urgency and frequency with a foul odor to her urine.  She states that she did not drink a lot of water over the last weekend as she was at a birthday party and did not want to use the restroom frequency and feels this may have precipitated her symptoms.  Patient denies any modifying or aggravating factors.  Patient denies any gross hematuria, dysuria or suprapubic/flank pain.  Patient denies any fevers, chills, nausea or vomiting.     PMH: Past Medical History:  Diagnosis Date   Arthritis    Arthritis    Atrophic vaginitis    Bladder prolapse, female, acquired    Dysplastic nevus 12/24/2007   left inf deltoid   Hypertension    Hypothyroidism    Neuromuscular disorder ([place])    Polymyositis ([employer])    in past. still has some weakness   Urinary retention    Wears dentures    full upper, partial lower    Surgical History: Past Surgical History:  Procedure Laterality Date   ABDOMINAL HYSTERECTOMY     bladder tach  1993   CARPAL  TUNNEL RELEASE     CATARACT EXTRACTION W/PHACO Left 03/21/2018   Procedure: CATARACT EXTRACTION PHACO AND INTRAOCULAR LENS PLACEMENT ([place])  LEFT TORIC LENS;  Surgeon: Leandrew Koyanagi, MD;  Location: [facility];  Service: Ophthalmology;  Laterality: Left;   CATARACT EXTRACTION W/PHACO Right 04/11/2018   Procedure: CATARACT EXTRACTION PHACO AND INTRAOCULAR LENS PLACEMENT ([place])  RIGHT TORIC LENS;  Surgeon: Leandrew Koyanagi, MD;  Location: [facility];  Service: Ophthalmology;  Laterality: Right;   COLONOSCOPY     MUSCLE BIOPSY     REPLACEMENT TOTAL HIP W/  RESURFACING IMPLANTS  1996   Right & Left     Home Medications:  Allergies as of 06/03/2021       Reactions   Bactrim [sulfamethoxazole-trimethoprim] Nausea And Vomiting   Pollen Extract Other (See Comments)   respiratory   Sulfa Antibiotics Rash   Other reaction(s): Other (See Comments) respiratory And GI Upset   Sulfacetamide Sodium Rash   And GI Upset   Sulfamethoxazole-trimethoprim Rash        Medication List        Accurate as of June 03, 2021 11:59 PM. If you have any questions, ask your nurse or doctor.          acetaminophen 500 MG tablet Commonly known as: TYLENOL Take 500 mg by mouth.   ARTIFICIAL TEARS OP Apply to eye as needed.   aspirin 81 MG tablet Take 81 mg by mouth daily.  benazepril 10 MG tablet Commonly known as: LOTENSIN Take 10 mg by mouth daily.   CALCIUM 600 + D PO Take by mouth. Reported on 08/25/2015   cephALEXin 250 MG capsule Commonly known as: Keflex Take 1 capsule (250 mg total) by mouth daily.   cetirizine 10 MG tablet Commonly known as: ZYRTEC Take 10 mg by mouth daily.   CINNAMON PO Take by mouth daily.   ciprofloxacin 250 MG tablet Commonly known as: Cipro Take 1 tablet (250 mg total) by mouth 2 (two) times daily. Started by: [name], PA-C   fluticasone 50 MCG/ACT nasal spray Commonly known as: FLONASE Place 2 sprays into the  nose daily.   levothyroxine 100 MCG tablet Commonly known as: SYNTHROID TK 1 T PO QD IN THE MORNING OES   multivitamin tablet Take 1 tablet by mouth daily.   PROBIOTIC DAILY PO Take by mouth daily.   Vitamin D 50 MCG (2000 UT) Caps Take by mouth.        Allergies:  Allergies  Allergen Reactions   Bactrim [Sulfamethoxazole-Trimethoprim] Nausea And Vomiting   Pollen Extract Other (See Comments)    respiratory   Sulfa Antibiotics Rash    Other reaction(s): Other (See Comments) respiratory And GI Upset   Sulfacetamide Sodium Rash    And GI Upset   Sulfamethoxazole-Trimethoprim Rash    Family History: Family History  Problem Relation Age of Onset   Cancer Father        colon   Kidney cancer Neg Hx    Renal cancer Neg Hx    Bladder Cancer Neg Hx    Kidney disease Neg Hx    Breast cancer Neg Hx     Social History:  reports that she has never smoked. She has never used smokeless tobacco. She reports that she does not drink alcohol and does not use drugs.  ROS: Pertinent ROS in HPI  Physical Exam: BP 132/75   Pulse (!) 101   Ht 5\' 3"  (1.6 m)   Wt 168 lb (76.2 kg)   BMI 29.76 kg/m   Constitutional:  Well nourished. Alert and oriented, No acute distress. HEENT: [place] AT, mask in place.  Trachea midline Cardiovascular: No clubbing, cyanosis, or edema. Respiratory: Normal respiratory effort, no increased work of breathing. GU: No CVA tenderness.  No bladder fullness or masses.  Atrophic external genitalia, normal pubic hair distribution, no lesions.  Normal urethral meatus, no lesions, no prolapse, no discharge.   No urethral masses, tenderness and/or tenderness. No bladder fullness, tenderness or masses. Pale vagina mucosa, poor estrogen effect, no discharge, no lesions, fair pelvic support, grade II cystocele.  Anus and perineum are without rashes or lesions.     Neurologic: Grossly intact, no focal deficits, moving all 4 extremities. Psychiatric: Normal mood and  affect.    Laboratory Data: Urinalysis Component     Latest Ref Rng & Units 06/03/2021  Specific Gravity, UA     1.005 - 1.030 1.010  pH, UA     5.0 - 7.5 6.0  Color, UA     Yellow Yellow  Appearance Ur     Clear Cloudy (A)  Leukocytes,UA     Negative 2+ (A)  Protein,UA     Negative/Trace Negative  Glucose, UA     Negative Negative  Ketones, UA     Negative Negative  RBC, UA     Negative Trace (A)  Bilirubin, UA     Negative Negative  Urobilinogen, Ur  0.2 - 1.0 mg/dL 0.2  Nitrite, UA     Negative Positive (A)  Microscopic Examination      See below:   Component     Latest Ref Rng & Units 06/03/2021  WBC, UA     0 - 5 /hpf >30 (A)  RBC     0 - 2 /hpf 0-2  Epithelial Cells (non renal)     0 - 10 /hpf 0-10  Bacteria, UA     None seen/Few Many (A)  I have reviewed the labs.   Pertinent Imaging: N/A  Assessment & Plan:    1. UTI -UA grossly infected -Urine sent for culture -Cipro 250 mg BID x 7 days -will adjust if necessary once culture results are available -Patient is instructed to discontinue her suppressive Keflex while she is taking the Cipro and will resume once she completes the Cipro prescription -She is also to continue the probiotics and add cranberry tablets and d-mannose to her regimen  Return for Pending urine culture results.  These notes generated with voice recognition software. I apologize for typographical errors.  [name], PA-C  [facility] [address]  [place], [place] [address] [phone]

## 2021-06-06 LAB — CULTURE, URINE COMPREHENSIVE

## 2021-06-23 DIAGNOSIS — I10 Essential (primary) hypertension: Secondary | ICD-10-CM | POA: Diagnosis not present

## 2021-06-23 DIAGNOSIS — E039 Hypothyroidism, unspecified: Secondary | ICD-10-CM | POA: Diagnosis not present

## 2021-06-23 DIAGNOSIS — E782 Mixed hyperlipidemia: Secondary | ICD-10-CM | POA: Diagnosis not present

## 2021-06-23 DIAGNOSIS — M150 Primary generalized (osteo)arthritis: Secondary | ICD-10-CM | POA: Diagnosis not present

## 2021-07-22 ENCOUNTER — Other Ambulatory Visit: Payer: Self-pay | Admitting: Urology

## 2021-07-22 DIAGNOSIS — N3000 Acute cystitis without hematuria: Secondary | ICD-10-CM

## 2021-07-23 DIAGNOSIS — M150 Primary generalized (osteo)arthritis: Secondary | ICD-10-CM | POA: Diagnosis not present

## 2021-07-23 DIAGNOSIS — E039 Hypothyroidism, unspecified: Secondary | ICD-10-CM | POA: Diagnosis not present

## 2021-07-23 DIAGNOSIS — E782 Mixed hyperlipidemia: Secondary | ICD-10-CM | POA: Diagnosis not present

## 2021-07-23 DIAGNOSIS — I10 Essential (primary) hypertension: Secondary | ICD-10-CM | POA: Diagnosis not present

## 2021-08-24 DIAGNOSIS — M150 Primary generalized (osteo)arthritis: Secondary | ICD-10-CM | POA: Diagnosis not present

## 2021-08-24 DIAGNOSIS — I10 Essential (primary) hypertension: Secondary | ICD-10-CM | POA: Diagnosis not present

## 2021-08-24 DIAGNOSIS — E039 Hypothyroidism, unspecified: Secondary | ICD-10-CM | POA: Diagnosis not present

## 2021-08-24 DIAGNOSIS — E782 Mixed hyperlipidemia: Secondary | ICD-10-CM | POA: Diagnosis not present

## 2021-09-21 DIAGNOSIS — M150 Primary generalized (osteo)arthritis: Secondary | ICD-10-CM | POA: Diagnosis not present

## 2021-09-21 DIAGNOSIS — E782 Mixed hyperlipidemia: Secondary | ICD-10-CM | POA: Diagnosis not present

## 2021-09-21 DIAGNOSIS — E039 Hypothyroidism, unspecified: Secondary | ICD-10-CM | POA: Diagnosis not present

## 2021-09-21 DIAGNOSIS — I10 Essential (primary) hypertension: Secondary | ICD-10-CM | POA: Diagnosis not present

## 2021-10-20 ENCOUNTER — Other Ambulatory Visit: Payer: Self-pay | Admitting: Internal Medicine

## 2021-10-20 DIAGNOSIS — Z1231 Encounter for screening mammogram for malignant neoplasm of breast: Secondary | ICD-10-CM

## 2021-10-22 DIAGNOSIS — E782 Mixed hyperlipidemia: Secondary | ICD-10-CM | POA: Diagnosis not present

## 2021-10-22 DIAGNOSIS — E039 Hypothyroidism, unspecified: Secondary | ICD-10-CM | POA: Diagnosis not present

## 2021-10-22 DIAGNOSIS — M150 Primary generalized (osteo)arthritis: Secondary | ICD-10-CM | POA: Diagnosis not present

## 2021-10-22 DIAGNOSIS — I10 Essential (primary) hypertension: Secondary | ICD-10-CM | POA: Diagnosis not present

## 2021-11-06 DIAGNOSIS — R8290 Unspecified abnormal findings in urine: Secondary | ICD-10-CM | POA: Diagnosis not present

## 2021-11-06 DIAGNOSIS — R399 Unspecified symptoms and signs involving the genitourinary system: Secondary | ICD-10-CM | POA: Diagnosis not present

## 2021-11-06 DIAGNOSIS — N390 Urinary tract infection, site not specified: Secondary | ICD-10-CM | POA: Diagnosis not present

## 2021-12-06 ENCOUNTER — Encounter: Payer: Medicare Other | Admitting: Dermatology

## 2022-01-04 DIAGNOSIS — N390 Urinary tract infection, site not specified: Secondary | ICD-10-CM | POA: Diagnosis not present

## 2022-01-04 DIAGNOSIS — R350 Frequency of micturition: Secondary | ICD-10-CM | POA: Diagnosis not present

## 2022-01-04 DIAGNOSIS — J069 Acute upper respiratory infection, unspecified: Secondary | ICD-10-CM | POA: Diagnosis not present

## 2022-01-14 DIAGNOSIS — E039 Hypothyroidism, unspecified: Secondary | ICD-10-CM | POA: Diagnosis not present

## 2022-01-14 DIAGNOSIS — M109 Gout, unspecified: Secondary | ICD-10-CM | POA: Diagnosis not present

## 2022-01-14 DIAGNOSIS — I10 Essential (primary) hypertension: Secondary | ICD-10-CM | POA: Diagnosis not present

## 2022-01-17 DIAGNOSIS — M461 Sacroiliitis, not elsewhere classified: Secondary | ICD-10-CM | POA: Diagnosis not present

## 2022-01-17 DIAGNOSIS — M879 Osteonecrosis, unspecified: Secondary | ICD-10-CM | POA: Diagnosis not present

## 2022-01-17 DIAGNOSIS — Z471 Aftercare following joint replacement surgery: Secondary | ICD-10-CM | POA: Diagnosis not present

## 2022-01-17 DIAGNOSIS — Z96641 Presence of right artificial hip joint: Secondary | ICD-10-CM | POA: Diagnosis not present

## 2022-01-17 DIAGNOSIS — M87865 Other osteonecrosis, left fibula: Secondary | ICD-10-CM | POA: Diagnosis not present

## 2022-01-17 DIAGNOSIS — M25561 Pain in right knee: Secondary | ICD-10-CM | POA: Diagnosis not present

## 2022-01-17 DIAGNOSIS — Z96643 Presence of artificial hip joint, bilateral: Secondary | ICD-10-CM | POA: Diagnosis not present

## 2022-01-17 DIAGNOSIS — M25562 Pain in left knee: Secondary | ICD-10-CM | POA: Diagnosis not present

## 2022-01-17 DIAGNOSIS — M87864 Other osteonecrosis, right fibula: Secondary | ICD-10-CM | POA: Diagnosis not present

## 2022-01-17 DIAGNOSIS — M170 Bilateral primary osteoarthritis of knee: Secondary | ICD-10-CM | POA: Diagnosis not present

## 2022-01-17 DIAGNOSIS — G8929 Other chronic pain: Secondary | ICD-10-CM | POA: Diagnosis not present

## 2022-01-17 DIAGNOSIS — Z96642 Presence of left artificial hip joint: Secondary | ICD-10-CM | POA: Diagnosis not present

## 2022-02-17 ENCOUNTER — Ambulatory Visit
Admission: RE | Admit: 2022-02-17 | Discharge: 2022-02-17 | Disposition: A | Discharge status: Home / Self Care | Payer: Medicare Other | Source: Ambulatory Visit | Attending: Internal Medicine | Admitting: Internal Medicine

## 2022-02-17 DIAGNOSIS — Z1231 Encounter for screening mammogram for malignant neoplasm of breast: Secondary | ICD-10-CM | POA: Diagnosis not present

## 2022-03-21 ENCOUNTER — Encounter: Payer: Self-pay | Admitting: Dermatology

## 2022-03-21 ENCOUNTER — Ambulatory Visit (INDEPENDENT_AMBULATORY_CARE_PROVIDER_SITE_OTHER): Payer: Medicare Other | Admitting: Dermatology

## 2022-03-21 DIAGNOSIS — L820 Inflamed seborrheic keratosis: Secondary | ICD-10-CM | POA: Diagnosis not present

## 2022-03-21 DIAGNOSIS — D229 Melanocytic nevi, unspecified: Secondary | ICD-10-CM

## 2022-03-21 DIAGNOSIS — Z1283 Encounter for screening for malignant neoplasm of skin: Secondary | ICD-10-CM

## 2022-03-21 DIAGNOSIS — L578 Other skin changes due to chronic exposure to nonionizing radiation: Secondary | ICD-10-CM

## 2022-03-21 DIAGNOSIS — L720 Epidermal cyst: Secondary | ICD-10-CM | POA: Diagnosis not present

## 2022-03-21 DIAGNOSIS — D692 Other nonthrombocytopenic purpura: Secondary | ICD-10-CM | POA: Diagnosis not present

## 2022-03-21 DIAGNOSIS — L814 Other melanin hyperpigmentation: Secondary | ICD-10-CM

## 2022-03-21 DIAGNOSIS — Z86018 Personal history of other benign neoplasm: Secondary | ICD-10-CM | POA: Diagnosis not present

## 2022-03-21 DIAGNOSIS — D1800 Hemangioma unspecified site: Secondary | ICD-10-CM

## 2022-03-21 DIAGNOSIS — L821 Other seborrheic keratosis: Secondary | ICD-10-CM | POA: Diagnosis not present

## 2022-03-21 NOTE — Patient Instructions (Addendum)
Cryotherapy Aftercare  Wash gently with soap and water everyday.   Apply Vaseline daily until healed.   Clogged pores on chin: Arazlo cream samples given to be applied at bedtime to affected areas on chin, wash off in morning. Use until medicine is gone  Topical retinoid medications like tretinoin/Retin-A, adapalene/Differin, tazarotene/Fabior, and Epiduo/Epiduo Forte can cause dryness and irritation when first started. Only apply a pea-sized amount to the entire affected area. Avoid applying it around the eyes, edges of mouth and creases at the nose. If you experience irritation, use a good moisturizer first and/or apply the medicine less often. If you are doing well with the medicine, you can increase how often you use it until you are applying every night. Be careful with sun protection while using this medication as it can make you sensitive to the sun. This medicine should not be used by pregnant women.       Recommend daily broad spectrum sunscreen SPF 30+ to sun-exposed areas, reapply every 2 hours as needed. Call for new or changing lesions.  Staying in the shade or wearing long sleeves, sun glasses (UVA+UVB protection) and wide brim hats (4-inch brim around the entire circumference of the hat) are also recommended for sun protection.    Melanoma ABCDEs  Melanoma is the most dangerous type of skin cancer, and is the leading cause of death from skin disease.  You are more likely to develop melanoma if you: Have light-colored skin, light-colored eyes, or red or blond hair Spend a lot of time in the sun Tan regularly, either outdoors or in a tanning bed Have had blistering sunburns, especially during childhood Have a close family member who has had a melanoma Have atypical moles or large birthmarks  Early detection of melanoma is key since treatment is typically straightforward and cure rates are extremely high if we catch it early.   The first sign of melanoma is often a change in  a mole or a new dark spot.  The ABCDE system is a way of remembering the signs of melanoma.  A for asymmetry:  The two halves do not match. B for border:  The edges of the growth are irregular. C for color:  A mixture of colors are present instead of an even brown color. D for diameter:  Melanomas are usually (but not always) greater than 72m - the size of a pencil eraser. E for evolution:  The spot keeps changing in size, shape, and color.  Please check your skin once per month between visits. You can use a small mirror in front and a large mirror behind you to keep an eye on the back side or your body.   If you see any new or changing lesions before your next follow-up, please call to schedule a visit.  Please continue daily skin protection including broad spectrum sunscreen SPF 30+ to sun-exposed areas, reapplying every 2 hours as needed when you're outdoors.   Staying in the shade or wearing long sleeves, sun glasses (UVA+UVB protection) and wide brim hats (4-inch brim around the entire circumference of the hat) are also recommended for sun protection.     Due to recent changes in healthcare laws, you may see results of your pathology and/or laboratory studies on MyChart before the doctors have had a chance to review them. We understand that in some cases there may be results that are confusing or concerning to you. Please understand that not all results are received at the same time and  often the doctors may need to interpret multiple results in order to provide you with the best plan of care or course of treatment. Therefore, we ask that you please give [place] 2 business days to thoroughly review all your results before contacting the office for clarification. Should we see a critical lab result, you will be contacted sooner.   If You Need Anything After Your Visit  If you have any questions or concerns for your doctor, please call our main line at [phone] and press option 4 to reach  your doctor's medical assistant. If no one answers, please leave a voicemail as directed and we will return your call as soon as possible. Messages left after 4 pm will be answered the following business day.   You may also send [place] a message via [place]. We typically respond to MyChart messages within 1-2 business days.  For prescription refills, please ask your pharmacy to contact our office. Our fax number is [phone].  If you have an urgent issue when the clinic is closed that cannot wait until the next business day, you can page your doctor at the number below.    Please note that while we do our best to be available for urgent issues outside of office hours, we are not available 24/7.   If you have an urgent issue and are unable to reach [place], you may choose to seek medical care at your doctor's office, retail clinic, urgent care center, or emergency room.  If you have a medical emergency, please immediately call 911 or go to the emergency department.  Pager Numbers  - Dr. Nehemiah Massed: [phone]  - Dr. Laurence Ferrari: [phone]  - Dr. Nicole Kindred: [phone]  In the event of inclement weather, please call our main line at [phone] for an update on the status of any delays or closures.  Dermatology Medication Tips: Please keep the boxes that topical medications come in in order to help keep track of the instructions about where and how to use these. Pharmacies typically print the medication instructions only on the boxes and not directly on the medication tubes.   If your medication is too expensive, please contact our office at [phone] option 4 or send [place] a message through [place].   We are unable to tell what your co-pay for medications will be in advance as this is different depending on your insurance coverage. However, we may be able to find a substitute medication at lower cost or fill out paperwork to get insurance to cover a needed medication.   If a prior authorization  is required to get your medication covered by your insurance company, please allow [place] 1-2 business days to complete this process.  Drug prices often vary depending on where the prescription is filled and some pharmacies may offer cheaper prices.  The website www.goodrx.com contains coupons for medications through different pharmacies. The prices here do not account for what the cost may be with help from insurance (it may be cheaper with your insurance), but the website can give you the price if you did not use any insurance.  - You can print the associated coupon and take it with your prescription to the pharmacy.  - You may also stop by our office during regular business hours and pick up a GoodRx coupon card.  - If you need your prescription sent electronically to a different pharmacy, notify our office through [facility] or by phone at [phone] option 4.     Si Usted  Necesita Algo Despus de Su Visita  Tambin puede enviarnos un mensaje a travs de [place]. Por lo general respondemos a los mensajes de MyChart en el transcurso de 1 a 2 das hbiles.  Para renovar recetas, por favor pida a su farmacia que se ponga en contacto con nuestra oficina. Harland Dingwall de fax es [place] [phone].  Si tiene un asunto urgente cuando la clnica est cerrada y que no puede esperar hasta el siguiente da hbil, puede llamar/localizar a su doctor(a) al nmero que aparece a continuacin.   Por favor, tenga en cuenta que aunque hacemos todo lo posible para estar disponibles para asuntos urgentes fuera del horario de [place], no estamos disponibles las 24 horas del da, los 7 das de la [place].   Si tiene un problema urgente y no puede comunicarse con nosotros, puede optar por buscar atencin mdica  en el consultorio de su doctor(a), en una clnica privada, en un centro de atencin urgente o en una sala de emergencias.  Si tiene Engineering geologist, por favor llame inmediatamente al 911 o  vaya a la sala de emergencias.  Nmeros de bper  - Dr. Nehemiah Massed: [phone]  - Dra. Moye: [phone]  - Dra. Nicole Kindred: [phone]  En caso de inclemencias del [place], por favor llame a Johnsie Kindred principal al [phone] para una actualizacin sobre el [place] de cualquier retraso o cierre.  Consejos para la medicacin en dermatologa: Por favor, guarde las cajas en las que vienen los medicamentos de uso tpico para ayudarle a seguir las instrucciones sobre dnde y cmo usarlos. Las farmacias generalmente imprimen las instrucciones del medicamento slo en las cajas y no directamente en los tubos del [place].   Si su medicamento es muy caro, por favor, pngase en contacto con Zigmund Daniel llamando al [phone] y presione la opcin 4 o envenos un mensaje a travs de Pharmacist, community.   No podemos decirle cul ser su copago por los medicamentos por adelantado ya que esto es diferente dependiendo de la cobertura de su seguro. Sin embargo, es posible que podamos encontrar un medicamento sustituto a Electrical engineer un formulario para que el seguro cubra el medicamento que se considera necesario.   Si se requiere una autorizacin previa para que su compaa de seguros [place] su medicamento, por favor permtanos de 1 a 2 das hbiles para completar este proceso.  Los precios de los medicamentos varan con frecuencia dependiendo del Environmental consultant de dnde se surte la receta y alguna farmacias pueden ofrecer precios ms baratos.  El sitio web www.goodrx.com tiene cupones para medicamentos de Airline pilot. Los precios aqu no tienen en cuenta lo que podra costar con la ayuda del seguro (puede ser ms barato con su seguro), pero el sitio web puede darle el precio si no utiliz Research scientist (physical sciences).  - Puede imprimir el cupn correspondiente y llevarlo con su receta a la farmacia.  - Tambin puede pasar por nuestra oficina durante el horario de atencin regular y Charity fundraiser una tarjeta de cupones  de GoodRx.  - Si necesita que su receta se enve electrnicamente a una farmacia diferente, informe a nuestra oficina a travs de MyChart de [place] o por telfono llamando al [phone] y presione la opcin 4.

## 2022-03-21 NOTE — Progress Notes (Unsigned)
Follow-Up Visit   Subjective  Cassidy Bell is a 81 y.o. female who presents for the following: Annual Exam (Hx of dysplastic nevus. No personal history of skin cancer. Areas of concern today that are rough, bothersome, dark in color). The patient presents for Total-Body Skin Exam (TBSE) for skin cancer screening and mole check.  The patient has spots, moles and lesions to be evaluated, some may be new or changing and the patient has concerns that these could be cancer.  The following portions of the chart were reviewed this encounter and updated as appropriate:  Tobacco  Allergies  Meds  Problems  Med Hx  Surg Hx  Fam Hx     Review of Systems: No other skin or systemic complaints except as noted in HPI or Assessment and Plan.  Objective  Well appearing patient in no apparent distress; mood and affect are within normal limits.  A full examination was performed including scalp, head, eyes, ears, nose, lips, neck, chest, axillae, abdomen, back, buttocks, bilateral upper extremities, bilateral lower extremities, hands, feet, fingers, toes, fingernails, and toenails. All findings within normal limits unless otherwise noted below.  Chin Smooth white papule(s).   Right Medial Thigh x1 Erythematous keratotic or waxy stuck-on papule or plaque.   Assessment & Plan   History of Dysplastic Nevi. Left inferior deltoid. 12/24/2007. - No evidence of recurrence today - Recommend regular full body skin exams - Recommend daily broad spectrum sunscreen SPF 30+ to sun-exposed areas, reapply every 2 hours as needed.  - Call if any new or changing lesions are noted between office visits   Lentigines - Scattered tan macules - Due to sun exposure - Benign-appearing, observe - Recommend daily broad spectrum sunscreen SPF 30+ to sun-exposed areas, reapply every 2 hours as needed. - Call for any changes  Seborrheic Keratoses - Stuck-on, waxy, tan-brown papules and/or plaques  -  Benign-appearing - Discussed benign etiology and prognosis. - Observe - Call for any changes  Melanocytic Nevi - Tan-brown and/or pink-flesh-colored symmetric macules and papules - Benign appearing on exam today - Observation - Call clinic for new or changing moles - Recommend daily use of broad spectrum spf 30+ sunscreen to sun-exposed areas.   Hemangiomas - Red papules - Discussed benign nature - Observe - Call for any changes  Actinic Damage - Chronic condition, secondary to cumulative UV/sun exposure - diffuse scaly erythematous macules with underlying dyspigmentation - Recommend daily broad spectrum sunscreen SPF 30+ to sun-exposed areas, reapply every 2 hours as needed.  - Staying in the shade or wearing long sleeves, sun glasses (UVA+UVB protection) and wide brim hats (4-inch brim around the entire circumference of the hat) are also recommended for sun protection.  - Call for new or changing lesions.  Skin cancer screening performed today.  Purpura - Chronic; persistent and recurrent.  Treatable, but not curable. Arms. - Violaceous macules and patches - Benign - Related to trauma, age, sun damage and/or use of blood thinners, chronic use of topical and/or oral steroids - Observe - Can use OTC arnica containing moisturizer such as Dermend Bruise Formula if desired - Call for worsening or other concerns  Milia Chin Chronic and persistent condition with duration or expected duration over one year. Condition is symptomatic / bothersome to patient. Not to goal. Benign-appearing.  Observation.  Call clinic for new or changing lesions.  Recommend daily use of broad spectrum spf 30+ sunscreen to sun-exposed areas.   Arazlo cream samples given to be applied at  bedtime to affected areas, wash off in morning.  IF not resolving, consider extraction.  Topical retinoid medications like tretinoin/Retin-A, adapalene/Differin, tazarotene/Fabior, and Epiduo/Epiduo Forte can cause  dryness and irritation when first started. Only apply a pea-sized amount to the entire affected area. Avoid applying it around the eyes, edges of mouth and creases at the nose. If you experience irritation, use a good moisturizer first and/or apply the medicine less often. If you are doing well with the medicine, you can increase how often you use it until you are applying every night. Be careful with sun protection while using this medication as it can make you sensitive to the sun. This medicine should not be used by pregnant women.    Inflamed seborrheic keratosis Right Medial Thigh x1 Symptomatic, irritating, patient would like treated. Destruction of lesion - Right Medial Thigh x1 Complexity: simple   Destruction method: cryotherapy   Informed consent: discussed and consent obtained   Timeout:  patient name, date of birth, surgical site, and procedure verified Lesion destroyed using liquid nitrogen: Yes   Region frozen until ice ball extended beyond lesion: Yes   Outcome: patient tolerated procedure well with no complications   Post-procedure details: wound care instructions given   Additional details:  Prior to procedure, discussed risks of blister formation, small wound, skin dyspigmentation, or rare scar following cryotherapy. Recommend Vaseline ointment to treated areas while healing.  Return in about 1 year (around 03/22/2023) for TBSE, HxDN.  I, Emelia Salisbury, CMA, am acting as scribe for Sarina Ser, MD. Documentation: I have reviewed the above documentation for accuracy and completeness, and I agree with the above.  Sarina Ser, MD

## 2022-03-22 ENCOUNTER — Encounter: Payer: Self-pay | Admitting: Dermatology

## 2022-04-05 DIAGNOSIS — H353131 Nonexudative age-related macular degeneration, bilateral, early dry stage: Secondary | ICD-10-CM | POA: Diagnosis not present

## 2022-04-19 DIAGNOSIS — E039 Hypothyroidism, unspecified: Secondary | ICD-10-CM | POA: Diagnosis not present

## 2022-04-19 DIAGNOSIS — R7303 Prediabetes: Secondary | ICD-10-CM | POA: Diagnosis not present

## 2022-04-19 DIAGNOSIS — E782 Mixed hyperlipidemia: Secondary | ICD-10-CM | POA: Diagnosis not present

## 2022-04-19 DIAGNOSIS — M3320 Polymyositis, organ involvement unspecified: Secondary | ICD-10-CM | POA: Diagnosis not present

## 2022-04-19 DIAGNOSIS — I10 Essential (primary) hypertension: Secondary | ICD-10-CM | POA: Diagnosis not present

## 2022-04-19 DIAGNOSIS — M150 Primary generalized (osteo)arthritis: Secondary | ICD-10-CM | POA: Diagnosis not present

## 2022-04-26 DIAGNOSIS — I10 Essential (primary) hypertension: Secondary | ICD-10-CM | POA: Diagnosis not present

## 2022-04-26 DIAGNOSIS — E782 Mixed hyperlipidemia: Secondary | ICD-10-CM | POA: Diagnosis not present

## 2022-04-26 DIAGNOSIS — R7303 Prediabetes: Secondary | ICD-10-CM | POA: Diagnosis not present

## 2022-04-26 DIAGNOSIS — Z23 Encounter for immunization: Secondary | ICD-10-CM | POA: Diagnosis not present

## 2022-04-26 DIAGNOSIS — M3320 Polymyositis, organ involvement unspecified: Secondary | ICD-10-CM | POA: Diagnosis not present

## 2022-04-26 DIAGNOSIS — M109 Gout, unspecified: Secondary | ICD-10-CM | POA: Diagnosis not present

## 2022-04-26 DIAGNOSIS — E039 Hypothyroidism, unspecified: Secondary | ICD-10-CM | POA: Diagnosis not present

## 2022-04-26 DIAGNOSIS — M150 Primary generalized (osteo)arthritis: Secondary | ICD-10-CM | POA: Diagnosis not present

## 2022-04-26 DIAGNOSIS — Z0000 Encounter for general adult medical examination without abnormal findings: Secondary | ICD-10-CM | POA: Diagnosis not present

## 2022-05-16 ENCOUNTER — Ambulatory Visit (INDEPENDENT_AMBULATORY_CARE_PROVIDER_SITE_OTHER): Payer: Medicare Other | Admitting: Urology

## 2022-05-16 VITALS — BP 118/69 | HR 88 | Ht 63.0 in | Wt 174.0 lb

## 2022-05-16 DIAGNOSIS — N3020 Other chronic cystitis without hematuria: Secondary | ICD-10-CM | POA: Diagnosis not present

## 2022-05-16 LAB — URINALYSIS, COMPLETE
Bilirubin, UA: NEGATIVE
Glucose, UA: NEGATIVE
Ketones, UA: NEGATIVE
Nitrite, UA: NEGATIVE
Protein,UA: NEGATIVE
Specific Gravity, UA: 1.015 (ref 1.005–1.030)
Urobilinogen, Ur: 0.2 mg/dL (ref 0.2–1.0)
pH, UA: 6.5 (ref 5.0–7.5)

## 2022-05-16 LAB — MICROSCOPIC EXAMINATION: Epithelial Cells (non renal): >10 /hpf — AB (ref 0–10)

## 2022-05-16 MED ORDER — CEPHALEXIN 250 MG PO CAPS: 250.0000 mg | ORAL_CAPSULE | Freq: Every day | ORAL | 3 refills | Status: DC

## 2022-05-16 NOTE — Progress Notes (Signed)
05/16/2022 10:31 AM   Cassidy Bell 1941-06-25 [id number]  Referring provider: Merrilee Seashore, [place],  [place] [address]  Chief Complaint  Patient presents with   Cystitis   Follow-up    HPI: Reviewed note.  I saw the patient a year ago and switched her from Macrodantin to Keflex.  She had been on Estrace.  It looks like she had a breakthrough infection in November 2022.  Today Infection free on daily Keflex.  Frequency stable.  No acute symptoms    PMH: Past Medical History:  Diagnosis Date   Arthritis    Arthritis    Atrophic vaginitis    Bladder prolapse, female, acquired    Dysplastic nevus 12/24/2007   left inf deltoid   Hypertension    Hypothyroidism    Neuromuscular disorder ([facility])    Polymyositis ([place])    in past. still has some weakness   Urinary retention    Wears dentures    full upper, partial lower    Surgical History: Past Surgical History:  Procedure Laterality Date   ABDOMINAL HYSTERECTOMY     bladder tach  1993   CARPAL TUNNEL RELEASE     CATARACT EXTRACTION W/PHACO Left 03/21/2018   Procedure: CATARACT EXTRACTION PHACO AND INTRAOCULAR LENS PLACEMENT ([place])  LEFT TORIC LENS;  Surgeon: Leandrew Koyanagi, MD;  Location: [facility];  Service: Ophthalmology;  Laterality: Left;   CATARACT EXTRACTION W/PHACO Right 04/11/2018   Procedure: CATARACT EXTRACTION PHACO AND INTRAOCULAR LENS PLACEMENT ([place])  RIGHT TORIC LENS;  Surgeon: Leandrew Koyanagi, MD;  Location: [facility];  Service: Ophthalmology;  Laterality: Right;   COLONOSCOPY     MUSCLE BIOPSY     REPLACEMENT TOTAL HIP W/  RESURFACING IMPLANTS  1996   Right & Left     Home Medications:  Allergies as of 05/16/2022       Reactions   Bactrim [sulfamethoxazole-trimethoprim] Nausea And Vomiting   Pollen Extract Other (See Comments)   respiratory   Sulfa Antibiotics Rash   Other reaction(s): Other (See  Comments) respiratory And GI Upset   Sulfacetamide Sodium Rash   And GI Upset   Sulfamethoxazole-trimethoprim Rash        Medication List        Accurate as of May 16, 2022 10:31 AM. If you have any questions, ask your nurse or doctor.          STOP taking these medications    ciprofloxacin 250 MG tablet Commonly known as: Cipro       TAKE these medications    acetaminophen 500 MG tablet Commonly known as: TYLENOL Take 500 mg by mouth.   ARTIFICIAL TEARS OP Apply to eye as needed.   aspirin 81 MG tablet Take 81 mg by mouth daily.   benazepril 10 MG tablet Commonly known as: LOTENSIN Take 10 mg by mouth daily.   CALCIUM 600 + D PO Take by mouth. Reported on 08/25/2015   cephALEXin 250 MG capsule Commonly known as: Keflex Take 1 capsule (250 mg total) by mouth daily.   cetirizine 10 MG tablet Commonly known as: ZYRTEC Take 10 mg by mouth daily.   CINNAMON PO Take by mouth daily.   fluticasone 50 MCG/ACT nasal spray Commonly known as: FLONASE Place 2 sprays into the nose daily.   levothyroxine 100 MCG tablet Commonly known as: SYNTHROID TK 1 T PO QD IN THE MORNING OES   multivitamin tablet Take 1 tablet by mouth daily.  PROBIOTIC DAILY PO Take by mouth daily.   Vitamin D 50 MCG (2000 UT) Caps Take by mouth.        Allergies:  Allergies  Allergen Reactions   Bactrim [Sulfamethoxazole-Trimethoprim] Nausea And Vomiting   Pollen Extract Other (See Comments)    respiratory   Sulfa Antibiotics Rash    Other reaction(s): Other (See Comments) respiratory And GI Upset   Sulfacetamide Sodium Rash    And GI Upset   Sulfamethoxazole-Trimethoprim Rash    Family History: Family History  Problem Relation Age of Onset   Cancer Father        colon   Kidney cancer Neg Hx    Renal cancer Neg Hx    Bladder Cancer Neg Hx    Kidney disease Neg Hx    Breast cancer Neg Hx     Social History:  reports that she has never smoked. She has  never used smokeless tobacco. She reports that she does not drink alcohol and does not use drugs.  ROS:                                        Physical Exam: BP 118/69   Pulse 88   Ht '5\' 3"'$  (1.6 m)   Wt 78.9 kg   BMI 30.82 kg/m   Constitutional:  Alert and oriented, No acute distress.  Laboratory Data: Lab Results  Component Value Date   WBC 7.9 06/30/2014   HGB 10.5 (L) 07/01/2014   HCT 33.5 (L) 06/30/2014   MCV 89 06/30/2014   PLT 174 06/30/2014    Lab Results  Component Value Date   CREATININE 0.57 (L) 06/30/2014    No results found for: "PSA"  No results found for: "TESTOSTERONE"  No results found for: "HGBA1C"  Urinalysis    Component Value Date/Time   COLORURINE Yellow 06/28/2014 1216   APPEARANCEUR Cloudy (A) 06/03/2021 1021   LABSPEC 1.004 06/28/2014 1216   PHURINE 7.0 06/28/2014 1216   GLUCOSEU Negative 06/03/2021 1021   GLUCOSEU Negative 06/28/2014 1216   HGBUR Negative 06/28/2014 1216   BILIRUBINUR Negative 06/03/2021 1021   BILIRUBINUR Negative 06/28/2014 1216   KETONESUR Negative 06/28/2014 1216   PROTEINUR Negative 06/03/2021 1021   PROTEINUR Negative 06/28/2014 1216   NITRITE Positive (A) 06/03/2021 1021   NITRITE Positive 06/28/2014 1216   LEUKOCYTESUR 2+ (A) 06/03/2021 1021   LEUKOCYTESUR 3+ 06/28/2014 1216    Pertinent Imaging: Urine reviewed  Assessment & Plan: 90x3 Keflex sent to pharmacy and I will see in 1 year  1. Chronic cystitis  - Urinalysis, Complete   No follow-ups on file.  Reece Packer, MD  [facility] [address] [place],  [address] [phone]

## 2022-09-08 ENCOUNTER — Other Ambulatory Visit: Payer: Self-pay | Admitting: *Deleted

## 2022-09-08 MED ORDER — CEPHALEXIN 250 MG PO CAPS: 250.0000 mg | ORAL_CAPSULE | Freq: Every day | ORAL | 2 refills | Status: DC

## 2022-09-13 DIAGNOSIS — N390 Urinary tract infection, site not specified: Secondary | ICD-10-CM | POA: Diagnosis not present

## 2022-09-13 DIAGNOSIS — R300 Dysuria: Secondary | ICD-10-CM | POA: Diagnosis not present

## 2022-09-27 DIAGNOSIS — I10 Essential (primary) hypertension: Secondary | ICD-10-CM | POA: Diagnosis not present

## 2022-09-27 DIAGNOSIS — M109 Gout, unspecified: Secondary | ICD-10-CM | POA: Diagnosis not present

## 2022-09-27 DIAGNOSIS — J309 Allergic rhinitis, unspecified: Secondary | ICD-10-CM | POA: Diagnosis not present

## 2022-09-27 DIAGNOSIS — G8929 Other chronic pain: Secondary | ICD-10-CM | POA: Diagnosis not present

## 2022-09-27 DIAGNOSIS — E039 Hypothyroidism, unspecified: Secondary | ICD-10-CM | POA: Diagnosis not present

## 2022-09-27 DIAGNOSIS — R32 Unspecified urinary incontinence: Secondary | ICD-10-CM | POA: Diagnosis not present

## 2022-09-27 DIAGNOSIS — E669 Obesity, unspecified: Secondary | ICD-10-CM | POA: Diagnosis not present

## 2022-09-27 DIAGNOSIS — M1990 Unspecified osteoarthritis, unspecified site: Secondary | ICD-10-CM | POA: Diagnosis not present

## 2022-09-27 DIAGNOSIS — Z9849 Cataract extraction status, unspecified eye: Secondary | ICD-10-CM | POA: Diagnosis not present

## 2022-11-02 DIAGNOSIS — I10 Essential (primary) hypertension: Secondary | ICD-10-CM | POA: Diagnosis not present

## 2022-11-02 DIAGNOSIS — M109 Gout, unspecified: Secondary | ICD-10-CM | POA: Diagnosis not present

## 2022-11-08 DIAGNOSIS — E782 Mixed hyperlipidemia: Secondary | ICD-10-CM | POA: Diagnosis not present

## 2022-11-08 DIAGNOSIS — I10 Essential (primary) hypertension: Secondary | ICD-10-CM | POA: Diagnosis not present

## 2022-11-08 DIAGNOSIS — R7303 Prediabetes: Secondary | ICD-10-CM | POA: Diagnosis not present

## 2022-11-08 DIAGNOSIS — E039 Hypothyroidism, unspecified: Secondary | ICD-10-CM | POA: Diagnosis not present

## 2022-11-08 DIAGNOSIS — M109 Gout, unspecified: Secondary | ICD-10-CM | POA: Diagnosis not present

## 2023-01-11 ENCOUNTER — Ambulatory Visit: Payer: PPO | Admitting: Dermatology

## 2023-01-11 VITALS — BP 141/79 | HR 85

## 2023-01-11 DIAGNOSIS — Z7189 Other specified counseling: Secondary | ICD-10-CM | POA: Diagnosis not present

## 2023-01-11 DIAGNOSIS — L578 Other skin changes due to chronic exposure to nonionizing radiation: Secondary | ICD-10-CM | POA: Diagnosis not present

## 2023-01-11 DIAGNOSIS — W908XXA Exposure to other nonionizing radiation, initial encounter: Secondary | ICD-10-CM | POA: Diagnosis not present

## 2023-01-11 DIAGNOSIS — Z1283 Encounter for screening for malignant neoplasm of skin: Secondary | ICD-10-CM | POA: Diagnosis not present

## 2023-01-11 DIAGNOSIS — L720 Epidermal cyst: Secondary | ICD-10-CM

## 2023-01-11 DIAGNOSIS — I781 Nevus, non-neoplastic: Secondary | ICD-10-CM | POA: Diagnosis not present

## 2023-01-11 DIAGNOSIS — L814 Other melanin hyperpigmentation: Secondary | ICD-10-CM

## 2023-01-11 DIAGNOSIS — Z86018 Personal history of other benign neoplasm: Secondary | ICD-10-CM | POA: Diagnosis not present

## 2023-01-11 DIAGNOSIS — L821 Other seborrheic keratosis: Secondary | ICD-10-CM

## 2023-01-11 DIAGNOSIS — L820 Inflamed seborrheic keratosis: Secondary | ICD-10-CM | POA: Diagnosis not present

## 2023-01-11 DIAGNOSIS — D1801 Hemangioma of skin and subcutaneous tissue: Secondary | ICD-10-CM | POA: Diagnosis not present

## 2023-01-11 DIAGNOSIS — L729 Follicular cyst of the skin and subcutaneous tissue, unspecified: Secondary | ICD-10-CM

## 2023-01-11 DIAGNOSIS — D229 Melanocytic nevi, unspecified: Secondary | ICD-10-CM

## 2023-01-11 NOTE — Patient Instructions (Signed)
Due to recent changes in healthcare laws, you may see results of your pathology and/or laboratory studies on MyChart before the doctors have had a chance to review them. We understand that in some cases there may be results that are confusing or concerning to you. Please understand that not all results are received at the same time and often the doctors may need to interpret multiple results in order to provide you with the best plan of care or course of treatment. Therefore, we ask that you please give us 2 business days to thoroughly review all your results before contacting the office for clarification. Should we see a critical lab result, you will be contacted sooner.   If You Need Anything After Your Visit  If you have any questions or concerns for your doctor, please call our main line at [phone] and press option 4 to reach your doctor's medical assistant. If no one answers, please leave a voicemail as directed and we will return your call as soon as possible. Messages left after 4 pm will be answered the following business day.   You may also send us a message via MyChart. We typically respond to MyChart messages within 1-2 business days.  For prescription refills, please ask your pharmacy to contact our office. Our fax number is [phone].  If you have an urgent issue when the clinic is closed that cannot wait until the next business day, you can page your doctor at the number below.    Please note that while we do our best to be available for urgent issues outside of office hours, we are not available 24/7.   If you have an urgent issue and are unable to reach us, you may choose to seek medical care at your doctor's office, retail clinic, urgent care center, or emergency room.  If you have a medical emergency, please immediately call 911 or go to the emergency department.  Pager Numbers  - Dr. Kowalski: [phone]  - Dr. Moye: [phone]  - Dr. Stewart:  [phone]  In the event of inclement weather, please call our main line at [phone] for an update on the status of any delays or closures.  Dermatology Medication Tips: Please keep the boxes that topical medications come in in order to help keep track of the instructions about where and how to use these. Pharmacies typically print the medication instructions only on the boxes and not directly on the medication tubes.   If your medication is too expensive, please contact our office at [phone] option 4 or send us a message through MyChart.   We are unable to tell what your co-pay for medications will be in advance as this is different depending on your insurance coverage. However, we may be able to find a substitute medication at lower cost or fill out paperwork to get insurance to cover a needed medication.   If a prior authorization is required to get your medication covered by your insurance company, please allow us 1-2 business days to complete this process.  Drug prices often vary depending on where the prescription is filled and some pharmacies may offer cheaper prices.  The website www.goodrx.com contains coupons for medications through different pharmacies. The prices here do not account for what the cost may be with help from insurance (it may be cheaper with your insurance), but the website can give you the price if you did not use any insurance.  - You can print the associated coupon and take it with   your prescription to the pharmacy.  - You may also stop by our office during regular business hours and pick up a GoodRx coupon card.  - If you need your prescription sent electronically to a different pharmacy, notify our office through [facility] MyChart or by phone at [phone] option 4.     Si Usted Necesita Algo Despus de Su Visita  Tambin puede enviarnos un mensaje a travs de MyChart. Por lo general respondemos a los mensajes de MyChart en el transcurso de 1 a 2  das hbiles.  Para renovar recetas, por favor pida a su farmacia que se ponga en contacto con nuestra oficina. Nuestro nmero de fax es el [phone].  Si tiene un asunto urgente cuando la clnica est cerrada y que no puede esperar hasta el siguiente da hbil, puede llamar/localizar a su doctor(a) al nmero que aparece a continuacin.   Por favor, tenga en cuenta que aunque hacemos todo lo posible para estar disponibles para asuntos urgentes fuera del horario de oficina, no estamos disponibles las 24 horas del da, los 7 das de la semana.   Si tiene un problema urgente y no puede comunicarse con nosotros, puede optar por buscar atencin mdica  en el consultorio de su doctor(a), en una clnica privada, en un centro de atencin urgente o en una sala de emergencias.  Si tiene una emergencia mdica, por favor llame inmediatamente al 911 o vaya a la sala de emergencias.  Nmeros de bper  - Dr. Kowalski: [phone]  - Dra. Moye: [phone]  - Dra. Stewart: [phone]  En caso de inclemencias del tiempo, por favor llame a nuestra lnea principal al [phone] para una actualizacin sobre el estado de cualquier retraso o cierre.  Consejos para la medicacin en dermatologa: Por favor, guarde las cajas en las que vienen los medicamentos de uso tpico para ayudarle a seguir las instrucciones sobre dnde y cmo usarlos. Las farmacias generalmente imprimen las instrucciones del medicamento slo en las cajas y no directamente en los tubos del medicamento.   Si su medicamento es muy caro, por favor, pngase en contacto con nuestra oficina llamando al [phone] y presione la opcin 4 o envenos un mensaje a travs de MyChart.   No podemos decirle cul ser su copago por los medicamentos por adelantado ya que esto es diferente dependiendo de la cobertura de su seguro. Sin embargo, es posible que podamos encontrar un medicamento sustituto a menor costo o llenar un formulario para que el  seguro cubra el medicamento que se considera necesario.   Si se requiere una autorizacin previa para que su compaa de seguros cubra su medicamento, por favor permtanos de 1 a 2 das hbiles para completar este proceso.  Los precios de los medicamentos varan con frecuencia dependiendo del lugar de dnde se surte la receta y alguna farmacias pueden ofrecer precios ms baratos.  El sitio web www.goodrx.com tiene cupones para medicamentos de diferentes farmacias. Los precios aqu no tienen en cuenta lo que podra costar con la ayuda del seguro (puede ser ms barato con su seguro), pero el sitio web puede darle el precio si no utiliz ningn seguro.  - Puede imprimir el cupn correspondiente y llevarlo con su receta a la farmacia.  - Tambin puede pasar por nuestra oficina durante el horario de atencin regular y recoger una tarjeta de cupones de GoodRx.  - Si necesita que su receta se enve electrnicamente a una farmacia diferente, informe a nuestra oficina a travs de MyChart de [facility]   o por telfono llamando al [phone] y presione la opcin 4.  

## 2023-01-11 NOTE — Progress Notes (Unsigned)
Follow-Up Visit   Subjective  Cassidy Bell is a 82 y.o. female who presents for the following: Skin Cancer Screening and Upper Body Skin Exam The patient presents for Upper Body Skin Exam (UBSE) for skin cancer screening and mole check. The patient has spots, moles and lesions to be evaluated, some may be new or changing and the patient has concerns that these could be cancer.  The following portions of the chart were reviewed this encounter and updated as appropriate: medications, allergies, medical history  Review of Systems:  No other skin or systemic complaints except as noted in HPI or Assessment and Plan.  Objective  Well appearing patient in no apparent distress; mood and affect are within normal limits.  All skin waist up examined. Relevant physical exam findings are noted in the Assessment and Plan.  L upper back x 1 Erythematous stuck-on, waxy papule or plaque   Assessment & Plan   Inflamed seborrheic keratosis L upper back x 1  Destruction of lesion - L upper back x 1 Complexity: simple   Destruction method: cryotherapy   Informed consent: discussed and consent obtained   Timeout:  patient name, date of birth, surgical site, and procedure verified Lesion destroyed using liquid nitrogen: Yes   Region frozen until ice ball extended beyond lesion: Yes   Outcome: patient tolerated procedure well with no complications   Post-procedure details: wound care instructions given    Lentigines, Seborrheic Keratoses, Hemangiomas - Benign normal skin lesions - Benign-appearing - Call for any changes  Melanocytic Nevi - Tan-brown and/or pink-flesh-colored symmetric macules and papules - Benign appearing on exam today - Observation - Call clinic for new or changing moles - Recommend daily use of broad spectrum spf 30+ sunscreen to sun-exposed areas.   Actinic Damage - Chronic condition, secondary to cumulative UV/sun exposure - diffuse scaly erythematous macules  with underlying dyspigmentation - Recommend daily broad spectrum sunscreen SPF 30+ to sun-exposed areas, reapply every 2 hours as needed.  - Staying in the shade or wearing long sleeves, sun glasses (UVA+UVB protection) and wide brim hats (4-inch brim around the entire circumference of the hat) are also recommended for sun protection.  - Call for new or changing lesions.  HISTORY OF DYSPLASTIC NEVUS - L inf deltoid 2009 No evidence of recurrence today Recommend regular full body skin exams Recommend daily broad spectrum sunscreen SPF 30+ to sun-exposed areas, reapply every 2 hours as needed.  Call if any new or changing lesions are noted between office visits  Milia - L forehead  - tiny firm white papules - type of cyst - benign - may be extracted if symptomatic - observe  TELANGIECTASIA - chin and cheeks Exam: dilated blood vessel(s) Treatment Plan: Benign appearing on exam Call for changes Counseling for BBL / IPL / Laser and Coordination of Care Discussed the treatment option of Broad Band Light (BBL) [facility] Pulsed Light (IPL)/ Laser for skin discoloration, including brown spots and redness.  Typically we recommend at least 1-3 treatment sessions about 5-8 weeks apart for best results.  Cannot have tanned skin when BBL performed, and regular use of sunscreen is advised after the procedure to help maintain results. The patient's condition may also require "maintenance treatments" in the future.  The fee for BBL / laser treatments is $350 per treatment session for the whole face.  A fee can be quoted for other parts of the body.  Insurance typically does not pay for BBL/laser treatments and therefore the fee  is an out-of-pocket cost.  Skin cancer screening performed today.  Return if symptoms worsen or fail to improve.  Maylene Roes, CMA, am acting as scribe for Armida Sans, MD .  Documentation: I have reviewed the above documentation for accuracy and completeness, and I  agree with the above.  Armida Sans, MD

## 2023-01-12 ENCOUNTER — Encounter: Payer: Self-pay | Admitting: Dermatology

## 2023-03-13 ENCOUNTER — Ambulatory Visit: Payer: PPO | Admitting: Dermatology

## 2023-03-13 DIAGNOSIS — B028 Zoster with other complications: Secondary | ICD-10-CM

## 2023-03-13 DIAGNOSIS — Z79899 Other long term (current) drug therapy: Secondary | ICD-10-CM

## 2023-03-13 DIAGNOSIS — Z7189 Other specified counseling: Secondary | ICD-10-CM

## 2023-03-13 MED ORDER — VALACYCLOVIR HCL 500 MG PO TABS: ORAL_TABLET | ORAL | 0 refills | Status: DC

## 2023-03-13 NOTE — Patient Instructions (Signed)

## 2023-03-13 NOTE — Progress Notes (Signed)
   Follow Up Visit   Subjective  Cassidy Bell is a 82 y.o. female who presents for the following: Rash on the back and inframammary that started 2.5 weeks ago using OTC hydrocortisone and Aspercream which has helped, but areas wont resolve. Patient also c/o stabbing pain at areas.   The following portions of the chart were reviewed this encounter and updated as appropriate: medications, allergies, medical history  Review of Systems:  No other skin or systemic complaints except as noted in HPI or Assessment and Plan.  Objective  Well appearing patient in no apparent distress; mood and affect are within normal limits.  A focused examination was performed of the following areas: The face and trunk  Relevant exam findings are noted in the Assessment and Plan.   Assessment & Plan    RASH - consistent with shingles, patient has had one shingles vaccine.   Exam: L side inframammary, L side/back with grouped crusts and erythema in a linear pattern  Discussed viral etiology (herpes zoster) and risk of contagion. Before blisters dry up and heal, shingles rash can cause chickenpox in people who have never had it or have never been vaccinated against it, if they are exposed to the virus.  Keep area covered.  Avoid contact with pregnant women. Shingles can also cause significant pain or unusual skin sensations (post-herpetic neuralgia) which may persist after rash has resolved.  Shingles rash may leave dyspigmented scars on skin once healed.  Treatment Plan: Start Valtrex 500mg  2 po TID x 10 days. #60 0RF. Ok to continue [facility] cream BID PRN.   Return if symptoms worsen or fail to improve.  Maylene Roes, CMA, am acting as scribe for Armida Sans, MD .  Documentation: I have reviewed the above documentation for accuracy and completeness, and I agree with the above.  Armida Sans, MD

## 2023-03-20 ENCOUNTER — Encounter: Payer: Self-pay | Admitting: Dermatology

## 2023-03-23 ENCOUNTER — Telehealth: Payer: Self-pay

## 2023-03-23 MED ORDER — VALACYCLOVIR HCL 500 MG PO TABS: ORAL_TABLET | ORAL | 1 refills | Status: AC

## 2023-03-23 NOTE — Telephone Encounter (Signed)
Patient advised and prescription refill sent in. aw

## 2023-03-23 NOTE — Telephone Encounter (Signed)
Patient called office requesting more Valtrex from office visit last week. She has seen improvement but still having a flare under her breast. Okay to refill?

## 2023-04-04 ENCOUNTER — Telehealth: Payer: Self-pay

## 2023-04-04 ENCOUNTER — Other Ambulatory Visit: Payer: Self-pay | Admitting: Dermatology

## 2023-04-04 NOTE — Telephone Encounter (Signed)
Patient called about shingles outbreak you have seen her for. She states the place on her back hurts some and she has a new place under her breast that is very red and irritated.  She has had two rounds of Valtrex.

## 2023-04-05 ENCOUNTER — Ambulatory Visit: Payer: PPO | Admitting: Dermatology

## 2023-04-05 DIAGNOSIS — B029 Zoster without complications: Secondary | ICD-10-CM | POA: Diagnosis not present

## 2023-04-05 DIAGNOSIS — L821 Other seborrheic keratosis: Secondary | ICD-10-CM | POA: Diagnosis not present

## 2023-04-05 DIAGNOSIS — L820 Inflamed seborrheic keratosis: Secondary | ICD-10-CM | POA: Diagnosis not present

## 2023-04-05 NOTE — Progress Notes (Signed)
   Follow-Up Visit   Subjective  Cassidy Bell is a 82 y.o. female who presents for the following: Patient c/o itchy irritated growths under her left breast and back, patient think this could be related to her history of shingles, took Valtrex for 10 days ~1 month ago and using Hydrocortisone cream prn.  The patient has spots, moles and lesions to be evaluated, some may be new or changing and the patient may have concern these could be cancer.   The following portions of the chart were reviewed this encounter and updated as appropriate: medications, allergies, medical history  Review of Systems:  No other skin or systemic complaints except as noted in HPI or Assessment and Plan.  Objective  Well appearing patient in no apparent distress; mood and affect are within normal limits.  A focused examination was performed of the following areas:left back, left breast    Relevant exam findings are noted in the Assessment and Plan.  left inframammary x 1, left back x 1 (2) Stuck-on, waxy, tan-brown papule --Discussed benign etiology and prognosis.     Assessment & Plan   SEBORRHEIC KERATOSIS Back  - Stuck-on, waxy, tan-brown papules and/or plaques  - Benign-appearing - Discussed benign etiology and prognosis. - Observe - Call for any changes   Inflamed seborrheic keratosis (2) left inframammary x 1, left back x 1  Symptomatic, irritating, patient would like treated.   Destruction of lesion - left inframammary x 1, left back x 1 (2) Complexity: simple   Destruction method: cryotherapy   Informed consent: discussed and consent obtained   Timeout:  patient name, date of birth, surgical site, and procedure verified Lesion destroyed using liquid nitrogen: Yes   Region frozen until ice ball extended beyond lesion: Yes   Outcome: patient tolerated procedure well with no complications   Post-procedure details: wound care instructions given    Seborrheic keratosis  Herpes  zoster without complication   POST HERPES ZOSTER   Location: left chest and side Exam: few resolving pink areas   No sign of active shingles today.    Return if symptoms worsen or fail to improve.  IAngelique Holm, CMA, am acting as scribe for Armida Sans, MD .   Documentation: I have reviewed the above documentation for accuracy and completeness, and I agree with the above.  Armida Sans, MD

## 2023-04-05 NOTE — Patient Instructions (Addendum)

## 2023-04-07 ENCOUNTER — Encounter: Payer: Self-pay | Admitting: Dermatology

## 2023-04-25 DIAGNOSIS — M3320 Polymyositis, organ involvement unspecified: Secondary | ICD-10-CM | POA: Diagnosis not present

## 2023-04-25 DIAGNOSIS — M109 Gout, unspecified: Secondary | ICD-10-CM | POA: Diagnosis not present

## 2023-04-25 DIAGNOSIS — E782 Mixed hyperlipidemia: Secondary | ICD-10-CM | POA: Diagnosis not present

## 2023-04-25 DIAGNOSIS — I10 Essential (primary) hypertension: Secondary | ICD-10-CM | POA: Diagnosis not present

## 2023-04-25 DIAGNOSIS — E039 Hypothyroidism, unspecified: Secondary | ICD-10-CM | POA: Diagnosis not present

## 2023-04-25 DIAGNOSIS — R7303 Prediabetes: Secondary | ICD-10-CM | POA: Diagnosis not present

## 2023-04-25 DIAGNOSIS — M150 Primary generalized (osteo)arthritis: Secondary | ICD-10-CM | POA: Diagnosis not present

## 2023-05-02 DIAGNOSIS — Z0000 Encounter for general adult medical examination without abnormal findings: Secondary | ICD-10-CM | POA: Diagnosis not present

## 2023-05-02 DIAGNOSIS — N182 Chronic kidney disease, stage 2 (mild): Secondary | ICD-10-CM | POA: Diagnosis not present

## 2023-05-02 DIAGNOSIS — M109 Gout, unspecified: Secondary | ICD-10-CM | POA: Diagnosis not present

## 2023-05-02 DIAGNOSIS — R7303 Prediabetes: Secondary | ICD-10-CM | POA: Diagnosis not present

## 2023-05-02 DIAGNOSIS — M150 Primary generalized (osteo)arthritis: Secondary | ICD-10-CM | POA: Diagnosis not present

## 2023-05-02 DIAGNOSIS — M3320 Polymyositis, organ involvement unspecified: Secondary | ICD-10-CM | POA: Diagnosis not present

## 2023-05-02 DIAGNOSIS — E039 Hypothyroidism, unspecified: Secondary | ICD-10-CM | POA: Diagnosis not present

## 2023-05-02 DIAGNOSIS — I10 Essential (primary) hypertension: Secondary | ICD-10-CM | POA: Diagnosis not present

## 2023-05-02 DIAGNOSIS — Z23 Encounter for immunization: Secondary | ICD-10-CM | POA: Diagnosis not present

## 2023-05-02 DIAGNOSIS — E782 Mixed hyperlipidemia: Secondary | ICD-10-CM | POA: Diagnosis not present

## 2023-05-15 ENCOUNTER — Ambulatory Visit: Payer: PPO | Admitting: Urology

## 2023-05-15 ENCOUNTER — Encounter: Payer: Self-pay | Admitting: Urology

## 2023-05-15 VITALS — Ht 63.0 in | Wt 174.0 lb

## 2023-05-15 DIAGNOSIS — N3020 Other chronic cystitis without hematuria: Secondary | ICD-10-CM

## 2023-05-15 MED ORDER — CEPHALEXIN 250 MG PO CAPS: 250.0000 mg | ORAL_CAPSULE | Freq: Every day | ORAL | 3 refills | Status: DC

## 2023-05-15 NOTE — Progress Notes (Signed)
05/15/2023 11:05 AM   Cassidy Bell [birth year] [id number]  Referring provider: Georgianne Fick, MD [address] [place],  [place] [address]  Chief Complaint  Patient presents with   Follow-up    1 year follow-up    HPI: Reviewed note.  I saw the patient a year ago and switched her from Macrodantin to Keflex.  She had been on Estrace.  It looks like she had a breakthrough infection in November 2022.   Today Infection free on daily Keflex.  Frequency stable.  No acute symptoms  Today Infection free on daily Keflex.  Prescription ran out but clinically not infected.  Frequency stable.        PMH: Past Medical History:  Diagnosis Date   Arthritis    Arthritis    Atrophic vaginitis    Bladder prolapse, female, acquired    Dysplastic nevus 12/24/2007   left inf deltoid   Hypertension    Hypothyroidism    Neuromuscular disorder (HCC)    Polymyositis (HCC)    in past. still has some weakness   Urinary retention    Wears dentures    full upper, partial lower    Surgical History: Past Surgical History:  Procedure Laterality Date   ABDOMINAL HYSTERECTOMY     bladder tach  1993   CARPAL TUNNEL RELEASE     CATARACT EXTRACTION W/PHACO Left 03/21/2018   Procedure: CATARACT EXTRACTION PHACO AND INTRAOCULAR LENS PLACEMENT (IOC)  LEFT TORIC LENS;  Surgeon: Lockie Mola, MD;  Location: [facility];  Service: Ophthalmology;  Laterality: Left;   CATARACT EXTRACTION W/PHACO Right 04/11/2018   Procedure: CATARACT EXTRACTION PHACO AND INTRAOCULAR LENS PLACEMENT (IOC)  RIGHT TORIC LENS;  Surgeon: Lockie Mola, MD;  Location: [facility];  Service: Ophthalmology;  Laterality: Right;   COLONOSCOPY     MUSCLE BIOPSY     REPLACEMENT TOTAL HIP W/  RESURFACING IMPLANTS  1996   Right & Left     Home Medications:  Allergies as of 05/15/2023       Reactions   Bactrim [sulfamethoxazole-trimethoprim] Nausea And Vomiting   Pollen  Extract Other (See Comments)   respiratory   Sulfa Antibiotics Rash   Other reaction(s): Other (See Comments) respiratory And GI Upset   Sulfacetamide Sodium Rash   And GI Upset   Sulfamethoxazole-trimethoprim Rash        Medication List        Accurate as of May 15, 2023 11:05 AM. If you have any questions, ask your nurse or doctor.          STOP taking these medications    acetaminophen 500 MG tablet Commonly known as: TYLENOL Stopped by: Lorin Picket A Tim Corriher   aspirin 81 MG tablet Stopped by: Lorin Picket A Lutie Pickler   CINNAMON PO Stopped by: Lorin Picket A Lianah Peed       TAKE these medications    allopurinol 100 MG tablet Commonly known as: ZYLOPRIM Take 100 mg by mouth daily.   ARTIFICIAL TEARS OP Apply to eye as needed.   benazepril 10 MG tablet Commonly known as: LOTENSIN Take 10 mg by mouth daily.   CALCIUM 600 + D PO Take by mouth. Reported on 08/25/2015   cephALEXin 250 MG capsule Commonly known as: Keflex Take 1 capsule (250 mg total) by mouth daily.   cetirizine 10 MG tablet Commonly known as: ZYRTEC Take 10 mg by mouth daily.   fluticasone 50 MCG/ACT nasal spray Commonly known as: FLONASE Place 2 sprays into the  nose daily.   levothyroxine 100 MCG tablet Commonly known as: SYNTHROID TK 1 T PO QD IN THE MORNING OES   multivitamin tablet Take 1 tablet by mouth daily.   PROBIOTIC DAILY PO Take by mouth daily.   valACYclovir 500 MG tablet Commonly known as: VALTREX Take 2 tabs po TID x 10 days   Vitamin D 50 MCG (2000 UT) Caps Take by mouth.        Allergies:  Allergies  Allergen Reactions   Bactrim [Sulfamethoxazole-Trimethoprim] Nausea And Vomiting   Pollen Extract Other (See Comments)    respiratory   Sulfa Antibiotics Rash    Other reaction(s): Other (See Comments) respiratory And GI Upset   Sulfacetamide Sodium Rash    And GI Upset   Sulfamethoxazole-Trimethoprim Rash    Family History: Family History  Problem  Relation Age of Onset   Cancer Father        colon   Kidney cancer Neg Hx    Renal cancer Neg Hx    Bladder Cancer Neg Hx    Kidney disease Neg Hx    Breast cancer Neg Hx     Social History:  reports that she has never smoked. She has never been exposed to tobacco smoke. She has never used smokeless tobacco. She reports that she does not drink alcohol and does not use drugs.  ROS:                                        Physical Exam: Ht 5\' 3"  (1.6 m)   Wt 78.9 kg   BMI 30.82 kg/m   Constitutional:  Alert and oriented, No acute distress. HEENT: [place] AT, moist mucus membranes.  Trachea midline, no masses.  Laboratory Data: Lab Results  Component Value Date   WBC 7.9 06/30/2014   HGB 10.5 (L) 07/01/2014   HCT 33.5 (L) 06/30/2014   MCV 89 06/30/2014   PLT 174 06/30/2014    Lab Results  Component Value Date   CREATININE 0.57 (L) 06/30/2014    No results found for: "PSA"  No results found for: "TESTOSTERONE"  No results found for: "HGBA1C"  Urinalysis    Component Value Date/Time   COLORURINE Yellow 06/28/2014 1216   APPEARANCEUR Hazy (A) 05/16/2022 1019   LABSPEC 1.004 06/28/2014 1216   PHURINE 7.0 06/28/2014 1216   GLUCOSEU Negative 05/16/2022 1019   GLUCOSEU Negative 06/28/2014 1216   HGBUR Negative 06/28/2014 1216   BILIRUBINUR Negative 05/16/2022 1019   BILIRUBINUR Negative 06/28/2014 1216   KETONESUR Negative 06/28/2014 1216   PROTEINUR Negative 05/16/2022 1019   PROTEINUR Negative 06/28/2014 1216   NITRITE Negative 05/16/2022 1019   NITRITE Positive 06/28/2014 1216   LEUKOCYTESUR 1+ (A) 05/16/2022 1019   LEUKOCYTESUR 3+ 06/28/2014 1216    Pertinent Imaging:   Assessment & Plan: Prescription renewed 90 x 3 and see in 1 year  1. Chronic cystitis  - Urinalysis, Complete   No follow-ups on file.  Martina Sinner, MD  [facility] [address] [place], [place] [address] [phone]

## 2023-06-13 DIAGNOSIS — H26491 Other secondary cataract, right eye: Secondary | ICD-10-CM | POA: Diagnosis not present

## 2023-06-13 DIAGNOSIS — H43813 Vitreous degeneration, bilateral: Secondary | ICD-10-CM | POA: Diagnosis not present

## 2023-06-13 DIAGNOSIS — H353132 Nonexudative age-related macular degeneration, bilateral, intermediate dry stage: Secondary | ICD-10-CM | POA: Diagnosis not present

## 2023-06-23 DIAGNOSIS — N390 Urinary tract infection, site not specified: Secondary | ICD-10-CM | POA: Diagnosis not present

## 2023-06-23 DIAGNOSIS — R350 Frequency of micturition: Secondary | ICD-10-CM | POA: Diagnosis not present

## 2023-06-23 DIAGNOSIS — R8290 Unspecified abnormal findings in urine: Secondary | ICD-10-CM | POA: Diagnosis not present

## 2023-11-14 DIAGNOSIS — I10 Essential (primary) hypertension: Secondary | ICD-10-CM | POA: Diagnosis not present

## 2023-11-14 DIAGNOSIS — N182 Chronic kidney disease, stage 2 (mild): Secondary | ICD-10-CM | POA: Diagnosis not present

## 2023-11-14 DIAGNOSIS — R7303 Prediabetes: Secondary | ICD-10-CM | POA: Diagnosis not present

## 2023-11-14 DIAGNOSIS — E039 Hypothyroidism, unspecified: Secondary | ICD-10-CM | POA: Diagnosis not present

## 2023-11-14 DIAGNOSIS — E782 Mixed hyperlipidemia: Secondary | ICD-10-CM | POA: Diagnosis not present

## 2023-11-20 ENCOUNTER — Other Ambulatory Visit: Payer: Self-pay | Admitting: Dermatology

## 2023-11-21 DIAGNOSIS — N182 Chronic kidney disease, stage 2 (mild): Secondary | ICD-10-CM | POA: Diagnosis not present

## 2023-11-21 DIAGNOSIS — Z23 Encounter for immunization: Secondary | ICD-10-CM | POA: Diagnosis not present

## 2023-11-21 DIAGNOSIS — M3320 Polymyositis, organ involvement unspecified: Secondary | ICD-10-CM | POA: Diagnosis not present

## 2023-11-21 DIAGNOSIS — M150 Primary generalized (osteo)arthritis: Secondary | ICD-10-CM | POA: Diagnosis not present

## 2023-11-21 DIAGNOSIS — W19XXXA Unspecified fall, initial encounter: Secondary | ICD-10-CM | POA: Diagnosis not present

## 2023-11-21 DIAGNOSIS — R7303 Prediabetes: Secondary | ICD-10-CM | POA: Diagnosis not present

## 2023-11-21 DIAGNOSIS — M109 Gout, unspecified: Secondary | ICD-10-CM | POA: Diagnosis not present

## 2023-11-21 DIAGNOSIS — I10 Essential (primary) hypertension: Secondary | ICD-10-CM | POA: Diagnosis not present

## 2023-11-21 DIAGNOSIS — E039 Hypothyroidism, unspecified: Secondary | ICD-10-CM | POA: Diagnosis not present

## 2023-11-21 DIAGNOSIS — E782 Mixed hyperlipidemia: Secondary | ICD-10-CM | POA: Diagnosis not present

## 2023-11-25 DIAGNOSIS — M3320 Polymyositis, organ involvement unspecified: Secondary | ICD-10-CM | POA: Diagnosis not present

## 2023-11-25 DIAGNOSIS — Z96643 Presence of artificial hip joint, bilateral: Secondary | ICD-10-CM | POA: Diagnosis not present

## 2023-11-25 DIAGNOSIS — I129 Hypertensive chronic kidney disease with stage 1 through stage 4 chronic kidney disease, or unspecified chronic kidney disease: Secondary | ICD-10-CM | POA: Diagnosis not present

## 2023-11-25 DIAGNOSIS — R7303 Prediabetes: Secondary | ICD-10-CM | POA: Diagnosis not present

## 2023-11-25 DIAGNOSIS — N3281 Overactive bladder: Secondary | ICD-10-CM | POA: Diagnosis not present

## 2023-11-25 DIAGNOSIS — Z9181 History of falling: Secondary | ICD-10-CM | POA: Diagnosis not present

## 2023-11-25 DIAGNOSIS — Z604 Social exclusion and rejection: Secondary | ICD-10-CM | POA: Diagnosis not present

## 2023-11-25 DIAGNOSIS — Z792 Long term (current) use of antibiotics: Secondary | ICD-10-CM | POA: Diagnosis not present

## 2023-11-25 DIAGNOSIS — Z90710 Acquired absence of both cervix and uterus: Secondary | ICD-10-CM | POA: Diagnosis not present

## 2023-11-25 DIAGNOSIS — Z602 Problems related to living alone: Secondary | ICD-10-CM | POA: Diagnosis not present

## 2023-11-25 DIAGNOSIS — H9193 Unspecified hearing loss, bilateral: Secondary | ICD-10-CM | POA: Diagnosis not present

## 2023-11-25 DIAGNOSIS — E782 Mixed hyperlipidemia: Secondary | ICD-10-CM | POA: Diagnosis not present

## 2023-11-25 DIAGNOSIS — M1030 Gout due to renal impairment, unspecified site: Secondary | ICD-10-CM | POA: Diagnosis not present

## 2023-11-25 DIAGNOSIS — M150 Primary generalized (osteo)arthritis: Secondary | ICD-10-CM | POA: Diagnosis not present

## 2023-11-25 DIAGNOSIS — Z23 Encounter for immunization: Secondary | ICD-10-CM | POA: Diagnosis not present

## 2023-11-25 DIAGNOSIS — W19XXXD Unspecified fall, subsequent encounter: Secondary | ICD-10-CM | POA: Diagnosis not present

## 2023-11-25 DIAGNOSIS — E039 Hypothyroidism, unspecified: Secondary | ICD-10-CM | POA: Diagnosis not present

## 2023-11-25 DIAGNOSIS — N182 Chronic kidney disease, stage 2 (mild): Secondary | ICD-10-CM | POA: Diagnosis not present

## 2023-11-25 DIAGNOSIS — E669 Obesity, unspecified: Secondary | ICD-10-CM | POA: Diagnosis not present

## 2023-11-25 DIAGNOSIS — M549 Dorsalgia, unspecified: Secondary | ICD-10-CM | POA: Diagnosis not present

## 2023-12-12 DIAGNOSIS — I129 Hypertensive chronic kidney disease with stage 1 through stage 4 chronic kidney disease, or unspecified chronic kidney disease: Secondary | ICD-10-CM | POA: Diagnosis not present

## 2023-12-12 DIAGNOSIS — M150 Primary generalized (osteo)arthritis: Secondary | ICD-10-CM | POA: Diagnosis not present

## 2023-12-12 DIAGNOSIS — M3320 Polymyositis, organ involvement unspecified: Secondary | ICD-10-CM | POA: Diagnosis not present

## 2023-12-12 DIAGNOSIS — M549 Dorsalgia, unspecified: Secondary | ICD-10-CM | POA: Diagnosis not present

## 2024-01-22 DIAGNOSIS — Z471 Aftercare following joint replacement surgery: Secondary | ICD-10-CM | POA: Diagnosis not present

## 2024-01-22 DIAGNOSIS — Z96642 Presence of left artificial hip joint: Secondary | ICD-10-CM | POA: Diagnosis not present

## 2024-01-22 DIAGNOSIS — Z96641 Presence of right artificial hip joint: Secondary | ICD-10-CM | POA: Diagnosis not present

## 2024-01-22 DIAGNOSIS — Z96643 Presence of artificial hip joint, bilateral: Secondary | ICD-10-CM | POA: Diagnosis not present

## 2024-02-19 DIAGNOSIS — H43813 Vitreous degeneration, bilateral: Secondary | ICD-10-CM | POA: Diagnosis not present

## 2024-02-19 DIAGNOSIS — Z961 Presence of intraocular lens: Secondary | ICD-10-CM | POA: Diagnosis not present

## 2024-02-19 DIAGNOSIS — H353132 Nonexudative age-related macular degeneration, bilateral, intermediate dry stage: Secondary | ICD-10-CM | POA: Diagnosis not present

## 2024-04-17 DIAGNOSIS — M5459 Other low back pain: Secondary | ICD-10-CM | POA: Diagnosis not present

## 2024-04-17 DIAGNOSIS — R296 Repeated falls: Secondary | ICD-10-CM | POA: Diagnosis not present

## 2024-04-17 DIAGNOSIS — M6281 Muscle weakness (generalized): Secondary | ICD-10-CM | POA: Diagnosis not present

## 2024-04-25 DIAGNOSIS — M6281 Muscle weakness (generalized): Secondary | ICD-10-CM | POA: Diagnosis not present

## 2024-04-25 DIAGNOSIS — R296 Repeated falls: Secondary | ICD-10-CM | POA: Diagnosis not present

## 2024-04-25 DIAGNOSIS — M5459 Other low back pain: Secondary | ICD-10-CM | POA: Diagnosis not present

## 2024-04-30 DIAGNOSIS — R296 Repeated falls: Secondary | ICD-10-CM | POA: Diagnosis not present

## 2024-04-30 DIAGNOSIS — M6281 Muscle weakness (generalized): Secondary | ICD-10-CM | POA: Diagnosis not present

## 2024-04-30 DIAGNOSIS — M5459 Other low back pain: Secondary | ICD-10-CM | POA: Diagnosis not present

## 2024-05-02 DIAGNOSIS — R296 Repeated falls: Secondary | ICD-10-CM | POA: Diagnosis not present

## 2024-05-02 DIAGNOSIS — M5459 Other low back pain: Secondary | ICD-10-CM | POA: Diagnosis not present

## 2024-05-02 DIAGNOSIS — M6281 Muscle weakness (generalized): Secondary | ICD-10-CM | POA: Diagnosis not present

## 2024-05-07 DIAGNOSIS — M6281 Muscle weakness (generalized): Secondary | ICD-10-CM | POA: Diagnosis not present

## 2024-05-07 DIAGNOSIS — M5459 Other low back pain: Secondary | ICD-10-CM | POA: Diagnosis not present

## 2024-05-07 DIAGNOSIS — R296 Repeated falls: Secondary | ICD-10-CM | POA: Diagnosis not present

## 2024-05-09 DIAGNOSIS — M5459 Other low back pain: Secondary | ICD-10-CM | POA: Diagnosis not present

## 2024-05-09 DIAGNOSIS — R296 Repeated falls: Secondary | ICD-10-CM | POA: Diagnosis not present

## 2024-05-09 DIAGNOSIS — M6281 Muscle weakness (generalized): Secondary | ICD-10-CM | POA: Diagnosis not present

## 2024-05-13 ENCOUNTER — Ambulatory Visit: Payer: Self-pay | Admitting: Urology

## 2024-05-13 VITALS — BP 144/82 | HR 96 | Ht 62.0 in | Wt 160.0 lb

## 2024-05-13 DIAGNOSIS — N3020 Other chronic cystitis without hematuria: Secondary | ICD-10-CM

## 2024-05-13 DIAGNOSIS — R7303 Prediabetes: Secondary | ICD-10-CM | POA: Diagnosis not present

## 2024-05-13 DIAGNOSIS — M109 Gout, unspecified: Secondary | ICD-10-CM | POA: Diagnosis not present

## 2024-05-13 DIAGNOSIS — E039 Hypothyroidism, unspecified: Secondary | ICD-10-CM | POA: Diagnosis not present

## 2024-05-13 DIAGNOSIS — N182 Chronic kidney disease, stage 2 (mild): Secondary | ICD-10-CM | POA: Diagnosis not present

## 2024-05-13 DIAGNOSIS — M3320 Polymyositis, organ involvement unspecified: Secondary | ICD-10-CM | POA: Diagnosis not present

## 2024-05-13 DIAGNOSIS — I10 Essential (primary) hypertension: Secondary | ICD-10-CM | POA: Diagnosis not present

## 2024-05-13 DIAGNOSIS — M150 Primary generalized (osteo)arthritis: Secondary | ICD-10-CM | POA: Diagnosis not present

## 2024-05-13 DIAGNOSIS — E782 Mixed hyperlipidemia: Secondary | ICD-10-CM | POA: Diagnosis not present

## 2024-05-13 MED ORDER — CEPHALEXIN 250 MG PO CAPS: 250.0000 mg | ORAL_CAPSULE | Freq: Every day | ORAL | 3 refills | Status: AC

## 2024-05-13 NOTE — Progress Notes (Signed)
 05/13/2024 9:58 AM   Cassidy Bell 1941/06/19 [id number]  Referring provider: Verdia Lombard, MD [address] [place],  [place] [address]  No chief complaint on file.   HPI: Reviewed note.  I saw the patient a year ago and switched her from Macrodantin  to Keflex .  She had been on Estrace .  It looks like she had a breakthrough infection in November 2022.   Today Infection free on daily Keflex .  Frequency stable.  No acute symptoms   Today I no infections in the last year.  Frequency stable.  Very pleased   PMH: Past Medical History:  Diagnosis Date   Arthritis    Arthritis    Atrophic vaginitis    Bladder prolapse, female, acquired    Dysplastic nevus 12/24/2007   left inf deltoid   Hypertension    Hypothyroidism    Neuromuscular disorder (HCC)    Polymyositis (HCC)    in past. still has some weakness   Urinary retention    Wears dentures    full upper, partial lower    Surgical History: Past Surgical History:  Procedure Laterality Date   ABDOMINAL HYSTERECTOMY     bladder tach  1993   CARPAL TUNNEL RELEASE     CATARACT EXTRACTION W/PHACO Left 03/21/2018   Procedure: CATARACT EXTRACTION PHACO AND INTRAOCULAR LENS PLACEMENT (IOC)  LEFT TORIC LENS;  Surgeon: Mittie Gaskin, MD;  Location: [facility];  Service: Ophthalmology;  Laterality: Left;   CATARACT EXTRACTION W/PHACO Right 04/11/2018   Procedure: CATARACT EXTRACTION PHACO AND INTRAOCULAR LENS PLACEMENT (IOC)  RIGHT TORIC LENS;  Surgeon: Mittie Gaskin, MD;  Location: [facility];  Service: Ophthalmology;  Laterality: Right;   COLONOSCOPY     MUSCLE BIOPSY     REPLACEMENT TOTAL HIP W/  RESURFACING IMPLANTS  1996   Right & Left     Home Medications:  Allergies as of 05/13/2024       Reactions   Bactrim [sulfamethoxazole-trimethoprim ] Nausea And Vomiting   Pollen Extract Other (See Comments)   respiratory   Sulfa Antibiotics Rash   Other  reaction(s): Other (See Comments) respiratory And GI Upset   Sulfacetamide Sodium Rash   And GI Upset   Sulfamethoxazole-trimethoprim  Rash        Medication List        Accurate as of May 13, 2024  9:58 AM. If you have any questions, ask your nurse or doctor.          allopurinol 100 MG tablet Commonly known as: ZYLOPRIM Take 100 mg by mouth daily.   ARTIFICIAL TEARS OP Apply to eye as needed.   benazepril 10 MG tablet Commonly known as: LOTENSIN Take 10 mg by mouth daily.   CALCIUM 600 + D PO Take by mouth. Reported on 08/25/2015   cephALEXin  250 MG capsule Commonly known as: Keflex  Take 1 capsule (250 mg total) by mouth daily.   cetirizine 10 MG tablet Commonly known as: ZYRTEC Take 10 mg by mouth daily.   fluticasone 50 MCG/ACT nasal spray Commonly known as: FLONASE Place 2 sprays into the nose daily.   levothyroxine 100 MCG tablet Commonly known as: SYNTHROID TK 1 T PO QD IN THE MORNING OES   multivitamin tablet Take 1 tablet by mouth daily.   PROBIOTIC DAILY PO Take by mouth daily.   valACYclovir  500 MG tablet Commonly known as: VALTREX  Take 2 tabs po TID x 10 days   Vitamin D 50 MCG (2000 UT) Caps Take by  mouth.        Allergies:  Allergies  Allergen Reactions   Bactrim [Sulfamethoxazole-Trimethoprim ] Nausea And Vomiting   Pollen Extract Other (See Comments)    respiratory   Sulfa Antibiotics Rash    Other reaction(s): Other (See Comments) respiratory And GI Upset   Sulfacetamide Sodium Rash    And GI Upset   Sulfamethoxazole-Trimethoprim  Rash    Family History: Family History  Problem Relation Age of Onset   Cancer Father        colon   Kidney cancer Neg Hx    Renal cancer Neg Hx    Bladder Cancer Neg Hx    Kidney disease Neg Hx    Breast cancer Neg Hx     Social History:  reports that she has never smoked. She has never been exposed to tobacco smoke. She has never used smokeless tobacco. She reports that she does  not drink alcohol and does not use drugs.  ROS:                                        Physical Exam: BP (!) 144/82 (BP Location: Left Arm, Patient Position: Sitting, Cuff Size: Normal)   Pulse 96   Ht 5' 2 (1.575 m)   Wt 72.6 kg   SpO2 96%   BMI 29.26 kg/m   Constitutional:  Alert and oriented, No acute distress. HEENT: [place] AT, moist mucus membranes.  Trachea midline, no masses.  Laboratory Data: Lab Results  Component Value Date   WBC 7.9 06/30/2014   HGB 10.5 (L) 07/01/2014   HCT 33.5 (L) 06/30/2014   MCV 89 06/30/2014   PLT 174 06/30/2014    Lab Results  Component Value Date   CREATININE 0.57 (L) 06/30/2014    No results found for: PSA  No results found for: TESTOSTERONE  No results found for: HGBA1C  Urinalysis    Component Value Date/Time   COLORURINE Yellow 06/28/2014 1216   APPEARANCEUR Hazy (A) 05/16/2022 1019   LABSPEC 1.004 06/28/2014 1216   PHURINE 7.0 06/28/2014 1216   GLUCOSEU Negative 05/16/2022 1019   GLUCOSEU Negative 06/28/2014 1216   HGBUR Negative 06/28/2014 1216   BILIRUBINUR Negative 05/16/2022 1019   BILIRUBINUR Negative 06/28/2014 1216   KETONESUR Negative 06/28/2014 1216   PROTEINUR Negative 05/16/2022 1019   PROTEINUR Negative 06/28/2014 1216   NITRITE Negative 05/16/2022 1019   NITRITE Positive 06/28/2014 1216   LEUKOCYTESUR 1+ (A) 05/16/2022 1019   LEUKOCYTESUR 3+ 06/28/2014 1216    Pertinent Imaging:   Assessment & Plan: Keflex  90 x 3 sent to pharmacy and I will see in 1 year  1. Chronic cystitis (Primary)  - Urinalysis, Complete   No follow-ups on file.  Cassidy DELENA Elizabeth, MD  [facility] [address] [place], [place] [address] [phone]

## 2024-05-14 DIAGNOSIS — R296 Repeated falls: Secondary | ICD-10-CM | POA: Diagnosis not present

## 2024-05-14 DIAGNOSIS — M5459 Other low back pain: Secondary | ICD-10-CM | POA: Diagnosis not present

## 2024-05-14 DIAGNOSIS — M6281 Muscle weakness (generalized): Secondary | ICD-10-CM | POA: Diagnosis not present

## 2024-05-28 DIAGNOSIS — M3320 Polymyositis, organ involvement unspecified: Secondary | ICD-10-CM | POA: Diagnosis not present

## 2024-05-28 DIAGNOSIS — R7303 Prediabetes: Secondary | ICD-10-CM | POA: Diagnosis not present

## 2024-05-28 DIAGNOSIS — M150 Primary generalized (osteo)arthritis: Secondary | ICD-10-CM | POA: Diagnosis not present

## 2024-05-28 DIAGNOSIS — M109 Gout, unspecified: Secondary | ICD-10-CM | POA: Diagnosis not present

## 2024-05-28 DIAGNOSIS — N182 Chronic kidney disease, stage 2 (mild): Secondary | ICD-10-CM | POA: Diagnosis not present

## 2024-05-28 DIAGNOSIS — E782 Mixed hyperlipidemia: Secondary | ICD-10-CM | POA: Diagnosis not present

## 2024-05-28 DIAGNOSIS — Z0000 Encounter for general adult medical examination without abnormal findings: Secondary | ICD-10-CM | POA: Diagnosis not present

## 2024-05-28 DIAGNOSIS — I10 Essential (primary) hypertension: Secondary | ICD-10-CM | POA: Diagnosis not present

## 2024-05-28 DIAGNOSIS — E039 Hypothyroidism, unspecified: Secondary | ICD-10-CM | POA: Diagnosis not present

## 2024-05-28 DIAGNOSIS — Z789 Other specified health status: Secondary | ICD-10-CM | POA: Diagnosis not present

## 2024-05-28 DIAGNOSIS — L918 Other hypertrophic disorders of the skin: Secondary | ICD-10-CM | POA: Diagnosis not present

## 2024-05-28 DIAGNOSIS — Z23 Encounter for immunization: Secondary | ICD-10-CM | POA: Diagnosis not present

## 2025-05-12 ENCOUNTER — Ambulatory Visit: Admitting: Urology
